# Patient Record
Sex: Female | Born: 1960 | Race: Black or African American | Hispanic: No | Marital: Single | Smoking: Never smoker
Health system: Southern US, Community
[De-identification: ages and names within clinical notes are randomized; demographics above are authoritative.]

## PROBLEM LIST (undated history)

## (undated) DIAGNOSIS — I10 Essential (primary) hypertension: Secondary | ICD-10-CM

## (undated) DIAGNOSIS — K219 Gastro-esophageal reflux disease without esophagitis: Secondary | ICD-10-CM

## (undated) DIAGNOSIS — Z87440 Personal history of urinary (tract) infections: Secondary | ICD-10-CM

## (undated) DIAGNOSIS — M1990 Unspecified osteoarthritis, unspecified site: Secondary | ICD-10-CM

## (undated) DIAGNOSIS — F32A Depression, unspecified: Secondary | ICD-10-CM

## (undated) DIAGNOSIS — F329 Major depressive disorder, single episode, unspecified: Secondary | ICD-10-CM

## (undated) DIAGNOSIS — J45909 Unspecified asthma, uncomplicated: Secondary | ICD-10-CM

## (undated) DIAGNOSIS — H409 Unspecified glaucoma: Secondary | ICD-10-CM

## (undated) HISTORY — DX: Major depressive disorder, single episode, unspecified: F32.9

## (undated) HISTORY — DX: Unspecified asthma, uncomplicated: J45.909

## (undated) HISTORY — DX: Personal history of urinary (tract) infections: Z87.440

## (undated) HISTORY — DX: Unspecified osteoarthritis, unspecified site: M19.90

## (undated) HISTORY — DX: Depression, unspecified: F32.A

## (undated) HISTORY — DX: Essential (primary) hypertension: I10

## (undated) HISTORY — DX: Gastro-esophageal reflux disease without esophagitis: K21.9

## (undated) HISTORY — DX: Unspecified glaucoma: H40.9

## (undated) HISTORY — PX: CARPAL TUNNEL RELEASE: SHX101

---

## 2001-08-07 HISTORY — PX: ABDOMINAL HYSTERECTOMY: SHX81

## 2013-08-06 DIAGNOSIS — G4733 Obstructive sleep apnea (adult) (pediatric): Secondary | ICD-10-CM | POA: Insufficient documentation

## 2014-11-18 DIAGNOSIS — Z90710 Acquired absence of both cervix and uterus: Secondary | ICD-10-CM | POA: Insufficient documentation

## 2017-11-30 DIAGNOSIS — Z0000 Encounter for general adult medical examination without abnormal findings: Secondary | ICD-10-CM | POA: Diagnosis not present

## 2017-11-30 DIAGNOSIS — I10 Essential (primary) hypertension: Secondary | ICD-10-CM | POA: Diagnosis not present

## 2017-11-30 DIAGNOSIS — N390 Urinary tract infection, site not specified: Secondary | ICD-10-CM | POA: Diagnosis not present

## 2017-12-14 DIAGNOSIS — I10 Essential (primary) hypertension: Secondary | ICD-10-CM | POA: Diagnosis not present

## 2017-12-14 DIAGNOSIS — J45909 Unspecified asthma, uncomplicated: Secondary | ICD-10-CM | POA: Diagnosis not present

## 2017-12-14 DIAGNOSIS — R002 Palpitations: Secondary | ICD-10-CM | POA: Diagnosis not present

## 2018-02-08 DIAGNOSIS — J018 Other acute sinusitis: Secondary | ICD-10-CM | POA: Diagnosis not present

## 2018-02-08 DIAGNOSIS — Z1231 Encounter for screening mammogram for malignant neoplasm of breast: Secondary | ICD-10-CM | POA: Diagnosis not present

## 2018-02-08 DIAGNOSIS — R05 Cough: Secondary | ICD-10-CM | POA: Diagnosis not present

## 2018-03-27 ENCOUNTER — Other Ambulatory Visit (INDEPENDENT_AMBULATORY_CARE_PROVIDER_SITE_OTHER): Payer: Federal, State, Local not specified - PPO

## 2018-03-27 ENCOUNTER — Ambulatory Visit: Payer: Federal, State, Local not specified - PPO | Admitting: Nurse Practitioner

## 2018-03-27 ENCOUNTER — Encounter: Payer: Self-pay | Admitting: Nurse Practitioner

## 2018-03-27 VITALS — BP 114/84 | HR 77 | Ht 59.0 in | Wt 189.0 lb

## 2018-03-27 DIAGNOSIS — I10 Essential (primary) hypertension: Secondary | ICD-10-CM | POA: Diagnosis not present

## 2018-03-27 DIAGNOSIS — J4520 Mild intermittent asthma, uncomplicated: Secondary | ICD-10-CM

## 2018-03-27 DIAGNOSIS — R42 Dizziness and giddiness: Secondary | ICD-10-CM

## 2018-03-27 DIAGNOSIS — R079 Chest pain, unspecified: Secondary | ICD-10-CM

## 2018-03-27 DIAGNOSIS — M545 Low back pain: Secondary | ICD-10-CM

## 2018-03-27 DIAGNOSIS — R410 Disorientation, unspecified: Secondary | ICD-10-CM | POA: Diagnosis not present

## 2018-03-27 DIAGNOSIS — F32A Depression, unspecified: Secondary | ICD-10-CM | POA: Insufficient documentation

## 2018-03-27 DIAGNOSIS — J45909 Unspecified asthma, uncomplicated: Secondary | ICD-10-CM | POA: Insufficient documentation

## 2018-03-27 DIAGNOSIS — G8929 Other chronic pain: Secondary | ICD-10-CM | POA: Insufficient documentation

## 2018-03-27 DIAGNOSIS — F329 Major depressive disorder, single episode, unspecified: Secondary | ICD-10-CM

## 2018-03-27 LAB — COMPREHENSIVE METABOLIC PANEL
ALK PHOS: 86 U/L (ref 39–117)
ALT: 27 U/L (ref 0–35)
AST: 16 U/L (ref 0–37)
Albumin: 4.1 g/dL (ref 3.5–5.2)
BILIRUBIN TOTAL: 0.9 mg/dL (ref 0.2–1.2)
BUN: 13 mg/dL (ref 6–23)
CO2: 28 meq/L (ref 19–32)
CREATININE: 0.76 mg/dL (ref 0.40–1.20)
Calcium: 10 mg/dL (ref 8.4–10.5)
Chloride: 103 mEq/L (ref 96–112)
GFR: 100.85 mL/min (ref >60.00)
GLUCOSE: 97 mg/dL (ref 70–99)
Potassium: 3.7 mEq/L (ref 3.5–5.1)
Sodium: 140 mEq/L (ref 135–145)
TOTAL PROTEIN: 8 g/dL (ref 6.0–8.3)

## 2018-03-27 LAB — CBC
HCT: 40.5 % (ref 36.0–46.0)
HEMOGLOBIN: 14 g/dL (ref 12.0–15.0)
MCHC: 34.7 g/dL (ref 30.0–36.0)
MCV: 82.1 fl (ref 78.0–100.0)
Platelets: 296 10*3/uL (ref 150.0–400.0)
RBC: 4.94 Mil/uL (ref 3.87–5.11)
RDW: 14.3 % (ref 11.5–15.5)
WBC: 9.2 10*3/uL (ref 4.0–10.5)

## 2018-03-27 LAB — TSH: TSH: 0.99 u[IU]/mL (ref 0.35–4.50)

## 2018-03-27 LAB — HEMOGLOBIN A1C: HEMOGLOBIN A1C: 5.4 % (ref 4.6–6.5)

## 2018-03-27 LAB — MAGNESIUM: Magnesium: 2.2 mg/dL (ref 1.5–2.5)

## 2018-03-27 LAB — VITAMIN B12: Vitamin B-12: 585 pg/mL (ref 211–911)

## 2018-03-27 MED ORDER — ALBUTEROL SULFATE HFA 108 (90 BASE) MCG/ACT IN AERS: 2.0000 | INHALATION_SPRAY | Freq: Four times a day (QID) | RESPIRATORY_TRACT | 0 refills | Status: DC | PRN

## 2018-03-27 MED ORDER — LOSARTAN POTASSIUM 50 MG PO TABS: 50.0000 mg | ORAL_TABLET | Freq: Every day | ORAL | 1 refills | Status: DC

## 2018-03-27 NOTE — Progress Notes (Signed)
Name: Gabriella Porter   MRN: [id number]    DOB: Jun 30, 1961   Date:03/27/2018       Progress Note  Subjective  Chief Complaint  Chief Complaint  Patient presents with  . Establish Care    HPI Gabriella Porter is here today to establish care as a new patient to our practice, just moved to [place] from [place] and has not established with a new provider in [place]. She is maintained on a daily 325 asa, OTC MVI and vitamin D supplement. She is also maintained on lisinopril-HCTZ 20-12.5 daily for hypertension, takes medication daily as directed, has noticed some "high and low blood pressure readings" when she checks her blood pressure occasionally at home. She is requesting an albuterol refill today, which she uses occasionally for seasonal asthma, related to allergies, uses inhaler no more frequently than 1-2 times per month. She would like to discuss complaints of depression and confusion today. She tells me that she moved to [place] due to financial hardships she's been experiencing over the past 3 years, currently in court battle for monetary dispute with a Surveyor, mineralscontractor. She is also taking care of chronically ill mother here in [place]. She tells me that she frequently cries, feels she can't get her words out, and knows she is depressed. She was on celexa in the past for depression, did seem to help her some but never really felt fully better on the medication and has been off for some time. She denies SI, HI. She also tells me that she has been experienced several episodes of  memory loss and slurred speech over the past several months. She drives 1.5 hours to work daily and one day she even thinks she "blacked out' while driving her car, unsure of duration of episode, woke still driving her car. She says she occasionally feels dizzy and experiences intermittent chest pain and shortness of breath. She also experiences numbness and tingling to bilateral hands which she attributes to her chronic back pain. She denies fevers, weight loss,  loss of appetite, abdominal pain, vomiting, dysuria, hematuria, urinary frequency, constipation, diarrhea, edema.  BP Readings from Last 3 Encounters:  03/27/18 114/84     Past Medical History:  Diagnosis Date  . Arthritis   . Asthma   . Depression   . GERD (gastroesophageal reflux disease)   . History of recurrent UTI (urinary tract infection)   . Hypertension     Past Surgical History:  Procedure Laterality Date  . ABDOMINAL HYSTERECTOMY  2003    Family History  Problem Relation Age of Onset  . Diabetes Mother   . Heart disease Mother   . Hyperlipidemia Mother   . Hypertension Mother   . Stroke Mother   . Diabetes Father   . Arthritis Father   . Heart disease Father   . Kidney disease Father   . Cancer Sister   . Diabetes Maternal Grandmother   . Heart disease Maternal Grandmother   . Hypertension Maternal Grandmother   . Diabetes Maternal Grandfather   . Heart disease Maternal Grandfather   . Hypertension Maternal Grandfather   . Diabetes Paternal Grandmother   . Heart disease Paternal Grandmother   . Hypertension Paternal Grandmother   . Diabetes Paternal Grandfather   . Heart disease Paternal Grandfather   . Hypertension Paternal Grandfather   . Hyperlipidemia Sister   . Bipolar disorder Sister     Social History   Socioeconomic History  . Marital status: Single    Spouse name: Not on  file  . Number of children: Not on file  . Years of education: Not on file  . Highest education level: Not on file  Occupational History  . Not on file  Social Needs  . Financial resource strain: Not on file  . Food insecurity:    Worry: Not on file    Inability: Not on file  . Transportation needs:    Medical: Not on file    Non-medical: Not on file  Tobacco Use  . Smoking status: Never Smoker  . Smokeless tobacco: Never Used  Substance and Sexual Activity  . Alcohol use: Yes  . Drug use: Never  . Sexual activity: Not on file  Lifestyle  . Physical  activity:    Days per week: Not on file    Minutes per session: Not on file  . Stress: Not on file  Relationships  . Social connections:    Talks on phone: Not on file    Gets together: Not on file    Attends religious service: Not on file    Active member of club or organization: Not on file    Attends meetings of clubs or organizations: Not on file    Relationship status: Not on file  . Intimate partner violence:    Fear of current or ex partner: Not on file    Emotionally abused: Not on file    Physically abused: Not on file    Forced sexual activity: Not on file  Other Topics Concern  . Not on file  Social History Narrative   Works as Company secretaryinvestigative analyst     Current Outpatient Medications:  .  aspirin 325 MG tablet, Take 325 mg by mouth daily., Disp: , Rfl:  .  Multiple Vitamins-Minerals (ONE DAILY CALCIUM/IRON PO), Take 1 each by mouth daily., Disp: , Rfl:  .  Vitamin D3 (VITAMIN D) 25 MCG tablet, Take 1,000 Units by mouth daily., Disp: , Rfl:  .  albuterol (PROVENTIL HFA;VENTOLIN HFA) 108 (90 Base) MCG/ACT inhaler, Inhale 2 puffs into the lungs every 6 (six) hours as needed for wheezing or shortness of breath., Disp: 1 Inhaler, Rfl: 0 .  losartan (COZAAR) 50 MG tablet, Take 1 tablet (50 mg total) by mouth daily., Disp: 30 tablet, Rfl: 1  Allergies  Allergen Reactions  . Sulfa Antibiotics      ROS See HPI  Objective  Vitals:   03/27/18 0819  BP: 114/84  Pulse: 77  SpO2: 97%  Weight: 189 lb (85.7 kg)  Height: 4\' 11"  (1.499 m)    Body mass index is 38.17 kg/m.  Physical Exam Vital signs reviewed. Constitutional: Patient appears well-developed and well-nourished. No distress.  HENT: Head: Normocephalic and atraumatic.  Nose: Nose normal. Mouth/Throat: Oropharynx is clear and moist. No oropharyngeal exudate.  Eyes: Conjunctivae and EOM are normal. Pupils are equal, round, and reactive to light. No scleral icterus.  Neck: Normal range of motion. Neck  supple. No thyromegaly present.   Cardiovascular: Normal rate, regular rhythm and normal heart sounds.  No murmur heard. No BLE edema. Distal pulses intact. Pulmonary/Chest: Effort normal and breath sounds normal. No respiratory distress. Abdominal: Soft. Bowel sounds are normal, no distension.   Musculoskeletal: Normal range of motion,  No gross deformities Neurological: She is alert and oriented to person, place, and time. No cranial nerve deficit. Coordination, balance, strength, speech and gait are normal.  Skin: Skin is warm and dry. No rash noted. No erythema.  Psychiatric: Patient has a normal mood and  affect. behavior is normal. Judgment and thought content normal.   Assessment & Plan RTC in 2 weeks for F/U: HTN- medication adjustment, Depression- consider starting medication therapy  Chest pain, unspecified type Update labs Red flags and return precautions including when to seek emergency care discussed with patient during OV F/U with further recommendations pending lab results - CBC; Future - Comprehensive metabolic panel; Future - Hemoglobin A1c; Future - TSH; Future - Vitamin B12; Future - Magnesium; Future - Ambulatory referral to Cardiology - EKG 12-Lead-I have personally reviewed the EKG tracing and agree with computerized printout as noted: sinus rhythm, no acute abnormality, no old EKG available for comparison  Lightheadedness Due to episodes of confusion and possible syncope I have instructed her not to drive her car until evaluated by speciality Urgent Referral to neurology placed, cardiology referral placed Red flags and return precautions including when to seek emergency care discussed with patient during OV Update labs today F/U with further recommendations pending lab results - CBC; Future - Comprehensive metabolic panel; Future - Hemoglobin A1c; Future - TSH; Future - Vitamin B12; Future - Magnesium; Future - Ambulatory referral to Neurology - Ambulatory  referral to Cardiology  Confusion Due to episodes of confusion and possible syncope I have instructed her not to drive her car until evaluation by specialty Urgent Referral to neurology placed Red flags and return precautions including when to seek emergency care discussed with patient during OV - Ambulatory referral to Neurology - Hemoglobin A1c; Future

## 2018-03-27 NOTE — Assessment & Plan Note (Signed)
She is interested in medication therapy but we are making adjustments to her blood pressure medications today so will hold on adding another medication for now Referral to psychology for counseling placed, additional education and information provided on AVS including return precautions and when to seek emergency care RTC in 2 weeks, can consider adding medication if BP stable - Ambulatory referral to Psychology

## 2018-03-27 NOTE — Assessment & Plan Note (Signed)
BP reading slightly low today with reports of dizziness, lightheadedness Will stop lisinopril-HCTZ and start losartan-dosing and side effects discussed RTC in 2 weeks for F/U: recheck BP - CBC; Future - Comprehensive metabolic panel; Future - TSH; Future - losartan (COZAAR) 50 MG tablet; Take 1 tablet (50 mg total) by mouth daily.  Dispense: 30 tablet; Refill: 1

## 2018-03-27 NOTE — Assessment & Plan Note (Signed)
Stable Continue albuterol Prn F/U for new, worsening symptoms - albuterol (PROVENTIL HFA;VENTOLIN HFA) 108 (90 Base) MCG/ACT inhaler; Inhale 2 puffs into the lungs every 6 (six) hours as needed for wheezing or shortness of breath.  Dispense: 1 Inhaler; Refill: 0

## 2018-03-27 NOTE — Patient Instructions (Addendum)
Please head downstairs for lab work/x-rays.Marland Kitchen.abnormal, you will be contacted right away. Otherwise, I will contact you within a week about your test results and follow up recommendations  Referrals ordered: cardiology, neurology, psychology  Please stop your lisinopril-HCTZ. Please start losartan 50 once daily. Please return in about 2 weeks so I can recheck your blood pressure and we can figure out what to do next.  Please do not drive your car at this time, you should not drive until seen by neurology.  Living With Depression Everyone experiences occasional disappointment, sadness, and loss in their lives. When you are feeling down, blue, or sad for at least 2 weeks in a row, it may mean that you have depression. Depression can affect your thoughts and feelings, relationships, daily activities, and physical health. It is caused by changes in the way your brain functions. If you receive a diagnosis of depression, your health care provider will tell you which type of depression you have and what treatment options are available to you. If you are living with depression, there are ways to help you recover from it and also ways to prevent it from coming back. How to cope with lifestyle changes Coping with stress Stress is your body's reaction to life changes and events, both good and bad. Stressful situations may include:  Getting married.  The death of a spouse.  Losing a job.  Retiring.  Having a baby.  Stress can last just a few hours or it can be ongoing. Stress can play a major role in depression, so it is important to learn both how to cope with stress and how to think about it differently. Talk with your health care provider or a counselor if you would like to learn more about stress reduction. He or she may suggest some stress reduction techniques, such as:  Music therapy. This can include creating music or listening to music. Choose music that you enjoy and that inspires  you.  Mindfulness-based meditation. This kind of meditation can be done while sitting or walking. It involves being aware of your normal breaths, rather than trying to control your breathing.  Centering prayer. This is a kind of meditation that involves focusing on a spiritual word or phrase. Choose a word, phrase, or sacred image that is meaningful to you and that brings you peace.  Deep breathing. To do this, expand your stomach and inhale slowly through your nose. Hold your breath for 3-5 seconds, then exhale slowly, allowing your stomach muscles to relax.  Muscle relaxation. This involves intentionally tensing muscles then relaxing them.  Choose a stress reduction technique that fits your lifestyle and personality. Stress reduction techniques take time and practice to develop. Set aside 5-15 minutes a day to do them. Therapists can offer training in these techniques. The training may be covered by some insurance plans. Other things you can do to manage stress include:  Keeping a stress diary. This can help you learn what triggers your stress and ways to control your response.  Understanding what your limits are and saying no to requests or events that lead to a schedule that is too full.  Thinking about how you respond to certain situations. You may not be able to control everything, but you can control how you react.  Adding humor to your life by watching funny films or TV shows.  Making time for activities that help you relax and not feeling guilty about spending your time this way.  Medicines Your health care provider  may suggest certain medicines if he or she feels that they will help improve your condition. Avoid using alcohol and other substances that may prevent your medicines from working properly (may interact). It is also important to:  Talk with your pharmacist or health care provider about all the medicines that you take, their possible side effects, and what medicines are  safe to take together.  Make it your goal to take part in all treatment decisions (shared decision-making). This includes giving input on the side effects of medicines. It is best if shared decision-making with your health care provider is part of your total treatment plan.  If your health care provider prescribes a medicine, you may not notice the full benefits of it for 4-8 weeks. Most people who are treated for depression need to be on medicine for at least 6-12 months after they feel better. If you are taking medicines as part of your treatment, do not stop taking medicines without first talking to your health care provider. You may need to have the medicine slowly decreased (tapered) over time to decrease the risk of harmful side effects. Relationships Your health care provider may suggest family therapy along with individual therapy and drug therapy. While there may not be family problems that are causing you to feel depressed, it is still important to make sure your family learns as much as they can about your mental health. Having your family's support can help make your treatment successful. How to recognize changes in your condition Everyone has a different response to treatment for depression. Recovery from major depression happens when you have not had signs of major depression for two months. This may mean that you will start to:  Have more interest in doing activities.  Feel less hopeless than you did 2 months ago.  Have more energy.  Overeat less often, or have better or improving appetite.  Have better concentration.  Your health care provider will work with you to decide the next steps in your recovery. It is also important to recognize when your condition is getting worse. Watch for these signs:  Having fatigue or low energy.  Eating too much or too little.  Sleeping too much or too little.  Feeling restless, agitated, or hopeless.  Having trouble concentrating or  making decisions.  Having unexplained physical complaints.  Feeling irritable, angry, or aggressive.  Get help as soon as you or your family members notice these symptoms coming back. How to get support and help from others How to talk with friends and family members about your condition Talking to friends and family members about your condition can provide you with one way to get support and guidance. Reach out to trusted friends or family members, explain your symptoms to them, and let them know that you are working with a health care provider to treat your depression. Financial resources Not all insurance plans cover mental health care, so it is important to check with your insurance carrier. If paying for co-pays or counseling services is a problem, search for a local or county mental health care center. They may be able to offer public mental health care services at low or no cost when you are not able to see a private health care provider. If you are taking medicine for depression, you may be able to get the generic form, which may be less expensive. Some makers of prescription medicines also offer help to patients who cannot afford the medicines they need. Follow these instructions  at home:  Get the right amount and quality of sleep.  Cut down on using caffeine, tobacco, alcohol, and other potentially harmful substances.  Try to exercise, such as walking or lifting small weights.  Take over-the-counter and prescription medicines only as told by your health care provider.  Eat a healthy diet that includes plenty of vegetables, fruits, whole grains, low-fat dairy products, and lean protein. Do not eat a lot of foods that are high in solid fats, added sugars, or salt.  Keep all follow-up visits as told by your health care provider. This is important. Contact a health care provider if:  You stop taking your antidepressant medicines, and you have any of these  symptoms: ? Nausea. ? Headache. ? Feeling lightheaded. ? Chills and body aches. ? Not being able to sleep (insomnia).  You or your friends and family think your depression is getting worse. Get help right away if:  You have thoughts of hurting yourself or others. If you ever feel like you may hurt yourself or others, or have thoughts about taking your own life, get help right away. You can go to your nearest emergency department or call:  Your local emergency services (911 in the [place]  A suicide crisis helpline, such as the National Suicide Prevention Lifeline at 352-042-36901-(914)715-4292. This is open 24-hours a day.  Summary  If you are living with depression, there are ways to help you recover from it and also ways to prevent it from coming back.  Work with your health care team to create a management plan that includes counseling, stress management techniques, and healthy lifestyle habits. This information is not intended to replace advice given to you by your health care provider. Make sure you discuss any questions you have with your health care provider. Document Released: 06/26/2016 Document Revised: 06/26/2016 Document Reviewed: 06/26/2016 Elsevier Interactive Patient Education  [employer]2018 Elsevier Inc.

## 2018-03-28 ENCOUNTER — Telehealth: Payer: Self-pay | Admitting: Nurse Practitioner

## 2018-03-28 NOTE — Telephone Encounter (Signed)
-----   Message from Ottis Stainerri L Slaugenhaupt, CCC-SLP sent at 03/28/2018 11:30 AM EDT ----- FYI:  Your patient is scheduled to see Salomon Fickerri Bauert on 05/03/18. Pt is aware.  Thank you for the referral.

## 2018-03-29 ENCOUNTER — Encounter: Payer: Self-pay | Admitting: Neurology

## 2018-03-29 ENCOUNTER — Ambulatory Visit: Payer: Federal, State, Local not specified - PPO | Admitting: Neurology

## 2018-03-29 ENCOUNTER — Encounter (INDEPENDENT_AMBULATORY_CARE_PROVIDER_SITE_OTHER): Payer: Self-pay

## 2018-03-29 ENCOUNTER — Ambulatory Visit: Payer: Federal, State, Local not specified - PPO | Admitting: Internal Medicine

## 2018-03-29 ENCOUNTER — Encounter: Payer: Self-pay | Admitting: Internal Medicine

## 2018-03-29 VITALS — BP 132/93 | HR 87 | Ht 59.0 in | Wt 190.0 lb

## 2018-03-29 VITALS — BP 126/64 | HR 90 | Ht 59.0 in | Wt 194.0 lb

## 2018-03-29 DIAGNOSIS — Z13220 Encounter for screening for lipoid disorders: Secondary | ICD-10-CM

## 2018-03-29 DIAGNOSIS — G4733 Obstructive sleep apnea (adult) (pediatric): Secondary | ICD-10-CM

## 2018-03-29 DIAGNOSIS — F32A Depression, unspecified: Secondary | ICD-10-CM

## 2018-03-29 DIAGNOSIS — R413 Other amnesia: Secondary | ICD-10-CM | POA: Diagnosis not present

## 2018-03-29 DIAGNOSIS — R0789 Other chest pain: Secondary | ICD-10-CM

## 2018-03-29 DIAGNOSIS — I10 Essential (primary) hypertension: Secondary | ICD-10-CM | POA: Diagnosis not present

## 2018-03-29 DIAGNOSIS — F329 Major depressive disorder, single episode, unspecified: Secondary | ICD-10-CM | POA: Diagnosis not present

## 2018-03-29 DIAGNOSIS — R0602 Shortness of breath: Secondary | ICD-10-CM

## 2018-03-29 MED ORDER — ASPIRIN EC 81 MG PO TBEC: 81.0000 mg | DELAYED_RELEASE_TABLET | Freq: Every day | ORAL | Status: AC

## 2018-03-29 MED ORDER — BUPROPION HCL ER (SR) 100 MG PO TB12: 100.0000 mg | ORAL_TABLET | Freq: Every day | ORAL | 3 refills | Status: DC

## 2018-03-29 NOTE — Patient Instructions (Signed)
We will start Wellbutrin for stress.

## 2018-03-29 NOTE — Patient Instructions (Addendum)
Medication Instructions:  Your physician has recommended you make the following change in your medication:   DECREASE Aspirin to 81 mg once daily   Labwork: TODAY - cholesterol   Testing/Procedures: Your physician has requested that you have an echocardiogram. Echocardiography is a painless test that uses sound waves to create images of your heart. It provides your doctor with information about the size and shape of your heart and how well your heart's chambers and valves are working. This procedure takes approximately one hour. There are no restrictions for this procedure.   Follow-Up: Your physician recommends that you schedule a follow-up appointment in: as needed based on test results   If you need a refill on your cardiac medications before your next appointment, please call your pharmacy.   Thank you for choosing [facility]! Gabriella BridegroomMichelle Sheba Whaling, RN [phone]

## 2018-03-29 NOTE — Progress Notes (Signed)
Cardiology Office Note   Date:  03/29/2018   ID:  Gabriella Porter Harkey, DOB 01-02-1961, MRN [id number]  PCP:  Evaristo BuryShambley, Ashleigh N, NP  Cardiologist:   Dietrich PatesPaula Guillaume Weninger, MD    Pt referred by Donnalee CurryA Shambley for   History of Present Illness: Gabriella Porter Dimascio is a 57 y.o. female with a history of chest tightness , lightheadeness and swelling   Pt says sometimes she feels  Tightness   Sometimes in bed can feel pains   Sharp   Bought gas pills   May have helped a little but not much  Fades Episodes do not occur every day   Began about 1 year ago   Getting more noticeable     When not having spells she says she feels fatigued   Day starts early though  3 AM   Out by 4 am  Works as Psychologist, occupationaladministrator   Drives    Pt has been on asiprin 325  Years ago had stress test   In [place]   Reported OK   Current Meds  Medication Sig  . albuterol (PROVENTIL HFA;VENTOLIN HFA) 108 (90 Base) MCG/ACT inhaler Inhale 2 puffs into the lungs every 6 (six) hours as needed for wheezing or shortness of breath.  Marland Kitchen. aspirin 325 MG tablet Take 325 mg by mouth daily.  Marland Kitchen. buPROPion (WELLBUTRIN SR) 100 MG 12 hr tablet Take 1 tablet (100 mg total) by mouth daily.  Marland Kitchen. losartan (COZAAR) 50 MG tablet Take 1 tablet (50 mg total) by mouth daily.  . Multiple Vitamins-Minerals (ONE DAILY CALCIUM/IRON PO) Take 1 each by mouth daily.  . Vitamin D3 (VITAMIN D) 25 MCG tablet Take 1,000 Units by mouth daily.     Allergies:   Sulfa antibiotics   Past Medical History:  Diagnosis Date  . Arthritis   . Asthma   . Depression   . GERD (gastroesophageal reflux disease)   . History of recurrent UTI (urinary tract infection)   . Hypertension     Past Surgical History:  Procedure Laterality Date  . ABDOMINAL HYSTERECTOMY  2003     Social History:  The patient  reports that she has never smoked. She has never used smokeless tobacco. She reports that she drinks alcohol. She reports that she does not use drugs.   Family History:  The patient's  family history includes Arthritis in her father; Bipolar disorder in her sister; Cancer in her sister; Diabetes in her father, maternal grandfather, maternal grandmother, mother, paternal grandfather, and paternal grandmother; Heart disease in her father, maternal grandfather, maternal grandmother, mother, paternal grandfather, and paternal grandmother; Hyperlipidemia in her mother and sister; Hypertension in her maternal grandfather, maternal grandmother, mother, paternal grandfather, and paternal grandmother; Kidney disease in her father; Stroke in her mother.    ROS:  Please see the history of present illness. All other systems are reviewed and  Negative to the above problem except as noted.    PHYSICAL EXAM: VS:  BP 126/64   Pulse 90   Ht 4\' 11"  (1.499 m)   Wt 194 lb (88 kg)   LMP 08/07/2001   SpO2 99%   BMI 39.18 kg/m   GEN: Well nourished, well developed, in no acute distress  HEENT: normal  Neck: no JVD, carotid bruits, or masses Cardiac: RRR; no murmurs, rubs, or gallops,no edema  Respiratory:  clear to auscultation bilaterally, normal work of breathing GI: soft, nontender, nondistended, + BS  No hepatomegaly  MS: no deformity Moving all extremities   Skin:  warm and dry, no rash Neuro:  Strength and sensation are intact Psych: euthymic mood, full affect   EKG:  EKG is not ordered today.  On 8/21  SR 73 bpm   Nonspecific ST changes    Lipid Panel No results found for: CHOL, TRIG, HDL, CHOLHDL, VLDL, LDLCALC, LDLDIRECT    Wt Readings from Last 3 Encounters:  03/29/18 194 lb (88 kg)  03/29/18 190 lb (86.2 kg)  03/27/18 189 lb (85.7 kg)      ASSESSMENT AND PLAN:  1  Chest tightness   Atypical for coronary ischemia   WIll set up for echo to evaluate systolic / diastolic properties of heart   Consider stress test     2   Lpids   Check on lipids today      3  HTN   Adequate control  4   Edema   No significant edema  Current medicines are reviewed at length with the  patient today.  The patient does not have concerns regarding medicines.  Signed, Dietrich Pates, MD  03/29/2018 3:29 PM    [facility] [address] [place], [place], [place]  [address] Phone: [phone]; Fax: [phone]

## 2018-03-29 NOTE — Progress Notes (Signed)
Reason for visit: Memory disturbance  Referring physician: Dr. Lenice Llamas is a 57 y.o. female  History of present illness:  Ms. Michaelson is a 57 year old right-handed black female with a history of problems with memory that she claims has been present off and on over the last 7 years or more.  The patient indicates that she has had episodes of increased stress off and on her life that have paralleled problems with memory and concentration.  She also has had an evaluation for sleep apnea in the past, the diagnosis was apparently made only from a home study, the patient was placed on CPAP from that point, but she could not tolerate the CPAP.  The patient has not been actively treated for sleep apnea.  The patient has had another episode of increased stress recently, she has moved from [place] to [place] in March 2019, she has had issues with a Marketing executive who she has had to take to court.  The increased stress has resulted in cognitive decompensation once again.  The patient feels that she has difficulty with remembering names for people and short-term memory problems.  The patient is able to operate a motor vehicle safely without difficulty, but she relies on GPS to get around.  She is able to manage her own medications and appointments, she manages her own finances.  She continues to work and seems to function fairly well at work.  She does report some underlying fatigue, but she is able to manage a 1.5 Hour drive to and from work without getting sleepy or drowsy.  The patient does snore at night.  The patient reports some low back pain and some right leg numbness at times, she denies any weakness in the legs, she denies any significant balance problems but she does feel slightly imbalanced on occasion.  She reports occasional mild dizziness, no headaches.  She reports that her elderly mother at age 39 does have some memory problems.  The patient has been treated with  Celexa in the past for depression.  The patient has episodes of crying spells associated with her stress.  She is sent to this office for an evaluation.  Past Medical History:  Diagnosis Date  . Arthritis   . Asthma   . Depression   . GERD (gastroesophageal reflux disease)   . History of recurrent UTI (urinary tract infection)   . Hypertension     Past Surgical History:  Procedure Laterality Date  . ABDOMINAL HYSTERECTOMY  2003    Family History  Problem Relation Age of Onset  . Diabetes Mother   . Heart disease Mother   . Hyperlipidemia Mother   . Hypertension Mother   . Stroke Mother   . Diabetes Father   . Arthritis Father   . Heart disease Father   . Kidney disease Father   . Cancer Sister   . Diabetes Maternal Grandmother   . Heart disease Maternal Grandmother   . Hypertension Maternal Grandmother   . Diabetes Maternal Grandfather   . Heart disease Maternal Grandfather   . Hypertension Maternal Grandfather   . Diabetes Paternal Grandmother   . Heart disease Paternal Grandmother   . Hypertension Paternal Grandmother   . Diabetes Paternal Grandfather   . Heart disease Paternal Grandfather   . Hypertension Paternal Grandfather   . Hyperlipidemia Sister   . Bipolar disorder Sister     Social history:  reports that she has never smoked. She has never used  smokeless tobacco. She reports that she drinks alcohol. She reports that she does not use drugs.  Medications:  Prior to Admission medications   Medication Sig Start Date End Date Taking? Authorizing Provider  albuterol (PROVENTIL HFA;VENTOLIN HFA) 108 (90 Base) MCG/ACT inhaler Inhale 2 puffs into the lungs every 6 (six) hours as needed for wheezing or shortness of breath. 03/27/18  Yes Evaristo BuryShambley, Ashleigh N, NP  aspirin 325 MG tablet Take 325 mg by mouth daily.   Yes [provider]  losartan (COZAAR) 50 MG tablet Take 1 tablet (50 mg total) by mouth daily. 03/27/18  Yes Evaristo BuryShambley, Ashleigh N, NP  Multiple  Vitamins-Minerals (ONE DAILY CALCIUM/IRON PO) Take 1 each by mouth daily.   Yes [provider]  Vitamin D3 (VITAMIN D) 25 MCG tablet Take 1,000 Units by mouth daily.   Yes [provider]  buPROPion (WELLBUTRIN SR) 100 MG 12 hr tablet Take 1 tablet (100 mg total) by mouth daily. 03/29/18   York SpanielWillis, Charles K, MD      Allergies  Allergen Reactions  . Sulfa Antibiotics     ROS:  Out of a complete 14 system review of symptoms, the patient complains only of the following symptoms, and all other reviewed systems are negative.  Memory problems Depression, anxiety  Blood pressure (!) 132/93, pulse 87, height 4\' 11"  (1.499 m), weight 190 lb (86.2 kg), last menstrual period 08/07/2001.  Physical Exam  General: The patient is alert and cooperative at the time of the examination.  The patient is moderately to markedly obese.  Eyes: Pupils are equal, round, and reactive to light. Discs are flat bilaterally.  Neck: The neck is supple, no carotid bruits are noted.  Respiratory: The respiratory examination is clear.  Cardiovascular: The cardiovascular examination reveals a regular rate and rhythm, no obvious murmurs or rubs are noted.  Skin: Extremities are without significant edema.  Neurologic Exam  Mental status: The patient is alert and oriented x 3 at the time of the examination. The Mini-Mental status examination done today shows a total score 25/30.  Cranial nerves: Facial symmetry is present. There is good sensation of the face to pinprick and soft touch bilaterally. The strength of the facial muscles and the muscles to head turning and shoulder shrug are normal bilaterally. Speech is well enunciated, no aphasia or dysarthria is noted. Extraocular movements are full. Visual fields are full. The tongue is midline, and the patient has symmetric elevation of the soft palate. No obvious hearing deficits are noted.  Motor: The motor testing reveals 5 over 5 strength of  all 4 extremities. Good symmetric motor tone is noted throughout.  Sensory: Sensory testing is intact to pinprick, soft touch, vibration sensation, and position sense on all 4 extremities. No evidence of extinction is noted.  Coordination: Cerebellar testing reveals good finger-nose-finger and heel-to-shin bilaterally.  Gait and station: Gait is normal. Tandem gait is normal. Romberg is negative. No drift is seen.  Reflexes: Deep tendon reflexes are symmetric and normal bilaterally. Toes are downgoing bilaterally.   Assessment/Plan:  1.  Reported memory disturbance  2.  History of sleep apnea, untreated  3.  Depression and anxiety  By the patient's own history, she has had a 7-year history of intermittent episodes of problems with cognitive decompensation associated with periods of stress.  The patient is under a lot of stress recently, and once again she has decompensated.  The patient will be sent for MRI of the brain, she will have blood work  done today.  A recent vitamin B12 level was unremarkable.  The patient will be sent for sleep evaluation to reevaluate her sleep apnea.  I have recommended that she see a psychologist or counselor in the future to help her cope with stress in a more effective way to prevent cognitive decompensation.  The patient will follow-up in 6 months, will follow the memory issues over time.  The patient will be placed on Wellbutrin at 100 mg daily, she will call for any dose adjustments.  Marlan Palau MD 03/29/2018 9:39 AM  [facility] [address] [place], [place] [address]  Phone [phone] Fax [phone]

## 2018-03-30 LAB — LIPID PANEL
CHOLESTEROL TOTAL: 155 mg/dL (ref 100–199)
Chol/HDL Ratio: 3.4 ratio (ref 0.0–4.4)
HDL: 45 mg/dL (ref >39)
LDL CALC: 81 mg/dL (ref 0–99)
TRIGLYCERIDES: 144 mg/dL (ref 0–149)
VLDL CHOLESTEROL CAL: 29 mg/dL (ref 5–40)

## 2018-03-30 LAB — SEDIMENTATION RATE: Sed Rate: 44 mm/hr — ABNORMAL HIGH (ref 0–40)

## 2018-03-30 LAB — RPR: RPR Ser Ql: NONREACTIVE

## 2018-03-30 LAB — HIV ANTIBODY (ROUTINE TESTING W REFLEX): HIV Screen 4th Generation wRfx: NONREACTIVE

## 2018-04-01 ENCOUNTER — Ambulatory Visit: Payer: Federal, State, Local not specified - PPO | Admitting: Neurology

## 2018-04-01 ENCOUNTER — Encounter

## 2018-04-01 ENCOUNTER — Telehealth: Payer: Self-pay | Admitting: Neurology

## 2018-04-01 NOTE — Telephone Encounter (Signed)
Spoke to patient she is scheduled for 04/02/18 for [facility].

## 2018-04-01 NOTE — Telephone Encounter (Signed)
spoke to pt she will get back to me  [employer] auth: NPR Ref # [id number]

## 2018-04-01 NOTE — Telephone Encounter (Signed)
[facility] to patient [facility]

## 2018-04-01 NOTE — Telephone Encounter (Signed)
Pt is asking to be called to schedule her MRI 

## 2018-04-02 ENCOUNTER — Other Ambulatory Visit (HOSPITAL_COMMUNITY): Payer: Self-pay

## 2018-04-02 ENCOUNTER — Ambulatory Visit: Payer: Federal, State, Local not specified - PPO

## 2018-04-02 DIAGNOSIS — R413 Other amnesia: Secondary | ICD-10-CM | POA: Diagnosis not present

## 2018-04-04 ENCOUNTER — Telehealth: Payer: Self-pay | Admitting: Neurology

## 2018-04-04 NOTE — Telephone Encounter (Signed)
I called the patient.  MRI of the brain was normal, the patient will be seeking a counselor to help her with her stress level in the near future.   MRI brain 04/04/17:  IMPRESSION:   MRI brain (without) demonstrating: - Non-specific fluid / inflammation in the left mastoid air cells noted.  - Brain parenchyma normal. No acute findings.

## 2018-04-05 ENCOUNTER — Other Ambulatory Visit: Payer: Self-pay

## 2018-04-05 ENCOUNTER — Ambulatory Visit (HOSPITAL_COMMUNITY): Payer: Federal, State, Local not specified - PPO | Attending: Internal Medicine

## 2018-04-05 ENCOUNTER — Telehealth: Payer: Self-pay

## 2018-04-05 DIAGNOSIS — R0789 Other chest pain: Secondary | ICD-10-CM | POA: Insufficient documentation

## 2018-04-05 DIAGNOSIS — Z13220 Encounter for screening for lipoid disorders: Secondary | ICD-10-CM | POA: Diagnosis not present

## 2018-04-05 DIAGNOSIS — R0602 Shortness of breath: Secondary | ICD-10-CM | POA: Diagnosis not present

## 2018-04-05 DIAGNOSIS — I119 Hypertensive heart disease without heart failure: Secondary | ICD-10-CM | POA: Insufficient documentation

## 2018-04-05 NOTE — Telephone Encounter (Signed)
Spoke with patient she expressed understanding and agreed to the exercise myoview. Made a letter and gave her the instructions over the phone. [facility]

## 2018-04-05 NOTE — Telephone Encounter (Signed)
-----   Message from Dietrich PatesPaula Ross V, MD sent at 04/05/2018  3:34 PM EDT ----- Pumping function of the heart is normal    Aortic valve is mildly thickened   Moves OK  Would set up for exercise myovue to evaluate for ischemia

## 2018-04-09 ENCOUNTER — Telehealth (HOSPITAL_COMMUNITY): Payer: Self-pay | Admitting: *Deleted

## 2018-04-09 NOTE — Telephone Encounter (Signed)
Patient given detailed instructions per Myocardial Perfusion Study Information Sheet for the test on 04/12/18 at 1015. Patient notified to arrive 15 minutes early and that it is imperative to arrive on time for appointment to keep from having the test rescheduled.  If you need to cancel or reschedule your appointment, please call the office within 24 hours of your appointment. . Patient verbalized understanding.Gabriella [place], [place]ella [place]

## 2018-04-12 ENCOUNTER — Ambulatory Visit (HOSPITAL_COMMUNITY): Payer: Federal, State, Local not specified - PPO | Attending: Cardiovascular Disease

## 2018-04-12 ENCOUNTER — Ambulatory Visit: Payer: Federal, State, Local not specified - PPO | Admitting: Nurse Practitioner

## 2018-04-12 ENCOUNTER — Encounter: Payer: Self-pay | Admitting: Nurse Practitioner

## 2018-04-12 VITALS — BP 110/78 | HR 81 | Ht 59.0 in | Wt 191.0 lb

## 2018-04-12 DIAGNOSIS — I10 Essential (primary) hypertension: Secondary | ICD-10-CM | POA: Diagnosis not present

## 2018-04-12 DIAGNOSIS — R0602 Shortness of breath: Secondary | ICD-10-CM | POA: Insufficient documentation

## 2018-04-12 DIAGNOSIS — R0789 Other chest pain: Secondary | ICD-10-CM | POA: Insufficient documentation

## 2018-04-12 DIAGNOSIS — E785 Hyperlipidemia, unspecified: Secondary | ICD-10-CM | POA: Insufficient documentation

## 2018-04-12 DIAGNOSIS — F329 Major depressive disorder, single episode, unspecified: Secondary | ICD-10-CM

## 2018-04-12 DIAGNOSIS — F32A Depression, unspecified: Secondary | ICD-10-CM

## 2018-04-12 LAB — MYOCARDIAL PERFUSION IMAGING
CHL CUP MPHR: 163 {beats}/min
CHL CUP NUCLEAR SSS: 12
CSEPEW: 8.6 METS
CSEPPHR: 141 {beats}/min
Exercise duration (min): 7 min
Exercise duration (sec): 30 s
LHR: 0.39
LV dias vol: 61 mL (ref 46–106)
LVSYSVOL: 21 mL
NUC STRESS TID: 1.16
Percent HR: 86 %
Rest HR: 85 {beats}/min
SDS: 1
SRS: 11

## 2018-04-12 MED ORDER — TECHNETIUM TC 99M TETROFOSMIN IV KIT
31.8000 | PACK | Freq: Once | INTRAVENOUS | Status: AC | PRN
  Administered 2018-04-12: 31.8 via INTRAVENOUS
  Filled 2018-04-12: qty 32

## 2018-04-12 MED ORDER — TECHNETIUM TC 99M TETROFOSMIN IV KIT
9.1000 | PACK | Freq: Once | INTRAVENOUS | Status: AC | PRN
  Administered 2018-04-12: 9.1 via INTRAVENOUS
  Filled 2018-04-12: qty 10

## 2018-04-12 NOTE — Progress Notes (Signed)
Name: Gabriella [place]   MRN: [id number]    DOB: Dec 19, 1960   Date:04/12/2018       Progress Note  Subjective  Chief Complaint Follow up  HPI Ms [place]ella [place] is here today for follow up, I first saw her on 03/27/18 to establish care and we discussed several complaints that she had. Since our first visit, she has seen cardiology and neurology for follow up as instructed, and is now awaiting upcoming stress testing and sleep evaluation.  Hypertension -her lisinopril-HCTZ was switched to losartan 50 daily at her last OV due to dizziness, lightheaded, lower BP readings Reports daily medication compliance without noted adverse medication effects. Reports she has not been checking her blood pressure readings since switching medications, but has noticed some improvement in her dizziness and lightheadedness. Denies headaches, vision changes, chest pain, shortness of breath, edema.  BP Readings from Last 3 Encounters:  04/12/18 110/78  03/29/18 126/64  03/29/18 (!) 132/93   Anxiety and depression-  At her last office visit, the following was noted: She tells me that she moved to [place] due to financial hardships she's been experiencing over the past 3 years, currently in court battle for monetary dispute with a Surveyor, minerals. She is also taking care of chronically ill mother here in [place]. She tells me that she frequently cries, feels she can't get her words out, and knows she is depressed. She was on celexa in the past for depression, did seem to help her some but never really felt fully better on the medication and has been off for some time. She denies SI, HI. She was then started on wellbutrin 100 daily at her neurology visit on 8/23 for stress with cognitive decompensation. She tells me today that she is feeling overall better, has noticed good improvement in her mood on wellbutrin, and has noticed some improvement in dizziness, lightheadedness and wonders if this was related to stress. She has not noticed any adverse  medication effects. She does tell me that she realized that she missed the wellbutrin  last week, forgot to put it on her pill box, but will restart today. She also scheduled first available appointment with counseling which is next month. Denies si, hi   Patient Active Problem List   Diagnosis Date Noted  . Essential hypertension 03/27/2018  . Extrinsic asthma 03/27/2018  . Depression 03/27/2018  . Chronic low back pain 03/27/2018    Past Surgical History:  Procedure Laterality Date  . ABDOMINAL HYSTERECTOMY  2003    Family History  Problem Relation Age of Onset  . Diabetes Mother   . Heart disease Mother   . Hyperlipidemia Mother   . Hypertension Mother   . Stroke Mother   . Diabetes Father   . Arthritis Father   . Heart disease Father   . Kidney disease Father   . Cancer Sister   . Diabetes Maternal Grandmother   . Heart disease Maternal Grandmother   . Hypertension Maternal Grandmother   . Diabetes Maternal Grandfather   . Heart disease Maternal Grandfather   . Hypertension Maternal Grandfather   . Diabetes Paternal Grandmother   . Heart disease Paternal Grandmother   . Hypertension Paternal Grandmother   . Diabetes Paternal Grandfather   . Heart disease Paternal Grandfather   . Hypertension Paternal Grandfather   . Hyperlipidemia Sister   . Bipolar disorder Sister     Social History   Socioeconomic History  . Marital status: Single    Spouse name: Not on file  .  Number of children: Not on file  . Years of education: Not on file  . Highest education level: Not on file  Occupational History  . Not on file  Social Needs  . Financial resource strain: Not on file  . Food insecurity:    Worry: Not on file    Inability: Not on file  . Transportation needs:    Medical: Not on file    Non-medical: Not on file  Tobacco Use  . Smoking status: Never Smoker  . Smokeless tobacco: Never Used  Substance and Sexual Activity  . Alcohol use: Yes  . Drug use:  Never  . Sexual activity: Not on file  Lifestyle  . Physical activity:    Days per week: Not on file    Minutes per session: Not on file  . Stress: Not on file  Relationships  . Social connections:    Talks on phone: Not on file    Gets together: Not on file    Attends religious service: Not on file    Active member of club or organization: Not on file    Attends meetings of clubs or organizations: Not on file    Relationship status: Not on file  . Intimate partner violence:    Fear of current or ex partner: Not on file    Emotionally abused: Not on file    Physically abused: Not on file    Forced sexual activity: Not on file  Other Topics Concern  . Not on file  Social History Narrative   Works as Company secretary     Current Outpatient Medications:  .  albuterol (PROVENTIL HFA;VENTOLIN HFA) 108 (90 Base) MCG/ACT inhaler, Inhale 2 puffs into the lungs every 6 (six) hours as needed for wheezing or shortness of breath., Disp: 1 Inhaler, Rfl: 0 .  aspirin EC 81 MG tablet, Take 1 tablet (81 mg total) by mouth daily., Disp: , Rfl:  .  buPROPion (WELLBUTRIN SR) 100 MG 12 hr tablet, Take 1 tablet (100 mg total) by mouth daily., Disp: 30 tablet, Rfl: 3 .  losartan (COZAAR) 50 MG tablet, Take 1 tablet (50 mg total) by mouth daily., Disp: 30 tablet, Rfl: 1 .  Multiple Vitamins-Minerals (ONE DAILY CALCIUM/IRON PO), Take 1 each by mouth daily., Disp: , Rfl:  .  Vitamin D3 (VITAMIN D) 25 MCG tablet, Take 1,000 Units by mouth daily., Disp: , Rfl:   Allergies  Allergen Reactions  . Sulfa Antibiotics      ROS See HPI  Objective  Vitals:   04/12/18 0759  BP: 110/78  Pulse: 81  SpO2: 96%  Weight: 191 lb (86.6 kg)  Height: 4\' 11"  (1.499 m)    Body mass index is 38.58 kg/m.  Physical Exam Vital signs reviewed. Constitutional: Patient appears well-developed and well-nourished. No distress.  HENT: Head: Normocephalic and atraumatic.  Nose: Nose normal. Mouth/Throat:  Oropharynx is clear and moist. No oropharyngeal exudate.  Eyes: Conjunctivae and EOM are normal. Pupils are equal, round, and reactive to light. No scleral icterus.  Neck: Normal range of motion. Neck supple.  Cardiovascular: Normal rate, regular rhythm and normal heart sounds.  No murmur heard. No BLE edema. Distal pulses intact. Pulmonary/Chest: Effort normal and breath sounds normal. No respiratory distress. Neurological: She is alert and oriented to person, place, and time. No cranial nerve deficit. Coordination, balance, strength, speech and gait are normal.  Skin: Skin is warm and dry. No rash noted. No erythema.  Psychiatric: Patient has a normal  mood and affect. behavior is normal. Judgment and thought content normal.   Assessment & Plan RTC in 3 months for F/U: HTN- recheck BP, Anxiety and depression-follow up  -Reviewed Health Maintenance: will review HM once acute problems are stable

## 2018-04-12 NOTE — Assessment & Plan Note (Signed)
BP appears stable on losartan, continue current dosage RTC in 3 months for F/U- recheck BP

## 2018-04-12 NOTE — Assessment & Plan Note (Signed)
Improving on wellbutrin, continue current dosage, discussed daily medication compliance for optimal medication benefit  Continue follow up with psychology for counseling as scheduled RTC in 3 months for follow up, or sooner if needed Additional education provided on AVS

## 2018-04-12 NOTE — Patient Instructions (Signed)
I am glad to see you are feeling better.  We will plan to meet back in about 3 months for follow up, or sooner if needed.   Stress and Stress Management Stress is a normal reaction to life events. It is what you feel when life demands more than you are used to or more than you can handle. Some stress can be useful. For example, the stress reaction can help you catch the last bus of the day, study for a test, or meet a deadline at work. But stress that occurs too often or for too long can cause problems. It can affect your emotional health and interfere with relationships and normal daily activities. Too much stress can weaken your immune system and increase your risk for physical illness. If you already have a medical problem, stress can make it worse. What are the causes? All sorts of life events may cause stress. An event that causes stress for one person may not be stressful for another person. Major life events commonly cause stress. These may be positive or negative. Examples include losing your job, moving into a new home, getting married, having a baby, or losing a loved one. Less obvious life events may also cause stress, especially if they occur day after day or in combination. Examples include working long hours, driving in traffic, caring for children, being in debt, or being in a difficult relationship. What are the signs or symptoms? Stress may cause emotional symptoms including, the following:  Anxiety. This is feeling worried, afraid, on edge, overwhelmed, or out of control.  Anger. This is feeling irritated or impatient.  Depression. This is feeling sad, down, helpless, or guilty.  Difficulty focusing, remembering, or making decisions.  Stress may cause physical symptoms, including the following:  Aches and pains. These may affect your head, neck, back, stomach, or other areas of your body.  Tight muscles or clenched jaw.  Low energy or trouble sleeping.  Stress may cause  unhealthy behaviors, including the following:  Eating to feel better (overeating) or skipping meals.  Sleeping too little, too much, or both.  Working too much or putting off tasks (procrastination).  Smoking, drinking alcohol, or using drugs to feel better.  How is this diagnosed? Stress is diagnosed through an assessment by your health care provider. Your health care provider will ask questions about your symptoms and any stressful life events.Your health care provider will also ask about your medical history and may order blood tests or other tests. Certain medical conditions and medicine can cause physical symptoms similar to stress. Mental illness can cause emotional symptoms and unhealthy behaviors similar to stress. Your health care provider may refer you to a mental health professional for further evaluation. How is this treated? Stress management is the recommended treatment for stress.The goals of stress management are reducing stressful life events and coping with stress in healthy ways. Techniques for reducing stressful life events include the following:  Stress identification. Self-monitor for stress and identify what causes stress for you. These skills may help you to avoid some stressful events.  Time management. Set your priorities, keep a calendar of events, and learn to say "no." These tools can help you avoid making too many commitments.  Techniques for coping with stress include the following:  Rethinking the problem. Try to think realistically about stressful events rather than ignoring them or overreacting. Try to find the positives in a stressful situation rather than focusing on the negatives.  Exercise. Physical exercise can  release both physical and emotional tension. The key is to find a form of exercise you enjoy and do it regularly.  Relaxation techniques. These relax the body and mind. Examples include yoga, meditation, tai chi, biofeedback, deep breathing,  progressive muscle relaxation, listening to music, being out in nature, journaling, and other hobbies. Again, the key is to find one or more that you enjoy and can do regularly.  Healthy lifestyle. Eat a balanced diet, get plenty of sleep, and do not smoke. Avoid using alcohol or drugs to relax.  Strong support network. Spend time with family, friends, or other people you enjoy being around.Express your feelings and talk things over with someone you trust.  Counseling or talktherapy with a mental health professional may be helpful if you are having difficulty managing stress on your own. Medicine is typically not recommended for the treatment of stress.Talk to your health care provider if you think you need medicine for symptoms of stress. Follow these instructions at home:  Keep all follow-up visits as directed by your health care provider.  Take all medicines as directed by your health care provider. Contact a health care provider if:  Your symptoms get worse or you start having new symptoms.  You feel overwhelmed by your problems and can no longer manage them on your own. Get help right away if:  You feel like hurting yourself or someone else. This information is not intended to replace advice given to you by your health care provider. Make sure you discuss any questions you have with your health care provider. Document Released: 01/17/2001 Document Revised: 12/30/2015 Document Reviewed: 03/18/2013 Elsevier Interactive Patient Education  2017 [employer].

## 2018-04-15 ENCOUNTER — Encounter (HOSPITAL_COMMUNITY): Payer: Self-pay

## 2018-04-18 ENCOUNTER — Other Ambulatory Visit: Payer: Self-pay | Admitting: Nurse Practitioner

## 2018-04-18 DIAGNOSIS — I10 Essential (primary) hypertension: Secondary | ICD-10-CM

## 2018-04-20 ENCOUNTER — Other Ambulatory Visit: Payer: Self-pay | Admitting: Neurology

## 2018-04-23 ENCOUNTER — Telehealth: Payer: Self-pay | Admitting: *Deleted

## 2018-04-23 ENCOUNTER — Other Ambulatory Visit: Payer: Self-pay | Admitting: Family

## 2018-04-23 MED ORDER — OLMESARTAN MEDOXOMIL 20 MG PO TABS: 20.0000 mg | ORAL_TABLET | Freq: Every day | ORAL | 1 refills | Status: DC

## 2018-04-23 NOTE — Telephone Encounter (Signed)
Sent in Benicar 20 mg daily; needs blood pressure check within 1 month to make sure tolerating okay; could do nurse visit-

## 2018-04-23 NOTE — Telephone Encounter (Signed)
Pt informed of below. She wants to call back and schedule a nurse visit.

## 2018-04-23 NOTE — Telephone Encounter (Signed)
Losartan is on back order. Pharmacy states Olmesartan is available. Please advise in PCP's absence. Thanks!

## 2018-05-03 ENCOUNTER — Ambulatory Visit: Payer: Federal, State, Local not specified - PPO | Admitting: Psychology

## 2018-05-03 DIAGNOSIS — F4323 Adjustment disorder with mixed anxiety and depressed mood: Secondary | ICD-10-CM | POA: Diagnosis not present

## 2018-05-10 DIAGNOSIS — H524 Presbyopia: Secondary | ICD-10-CM | POA: Diagnosis not present

## 2018-05-10 DIAGNOSIS — H40023 Open angle with borderline findings, high risk, bilateral: Secondary | ICD-10-CM | POA: Diagnosis not present

## 2018-05-10 DIAGNOSIS — H5213 Myopia, bilateral: Secondary | ICD-10-CM | POA: Diagnosis not present

## 2018-05-10 DIAGNOSIS — H2513 Age-related nuclear cataract, bilateral: Secondary | ICD-10-CM | POA: Diagnosis not present

## 2018-05-20 ENCOUNTER — Other Ambulatory Visit: Payer: Self-pay | Admitting: Nurse Practitioner

## 2018-05-20 NOTE — Telephone Encounter (Signed)
Copied from CRM [id number]. Topic: Quick Communication - Rx Refill/Question >> May 20, 2018  2:36 PM Gerrianne Scale wrote: Medication: olmesartan (BENICAR) 20 MG tablet  90 day refill  Has the patient contacted their pharmacy? Yes.  Thursday or Friday (Agent: If no, request that the patient contact the pharmacy for the refill.) (Agent: If yes, when and what did the pharmacy advise?) haven't heard from provider  Preferred Pharmacy (with phone number or street name):     [id number] [place], [place] - [address] AT [phone] (Phone) [phone] (Fax)    Agent: Please be advised that RX refills may take up to 3 business days. We ask that you follow-up with your pharmacy.

## 2018-05-21 MED ORDER — OLMESARTAN MEDOXOMIL 20 MG PO TABS: 20.0000 mg | ORAL_TABLET | Freq: Every day | ORAL | 0 refills | Status: DC

## 2018-05-21 NOTE — Telephone Encounter (Signed)
Requested Prescriptions  Pending Prescriptions Disp Refills  . olmesartan (BENICAR) 20 MG tablet 90 tablet 0    Sig: Take 1 tablet (20 mg total) by mouth daily.     Cardiovascular:  Angiotensin Receptor Blockers Passed - 05/20/2018  3:04 PM      Passed - Cr in normal range and within 180 days    Creatinine, Ser  Date Value Ref Range Status  03/27/2018 0.76 0.40 - 1.20 mg/dL Final         Passed - K in normal range and within 180 days    Potassium  Date Value Ref Range Status  03/27/2018 3.7 3.5 - 5.1 mEq/L Final         Passed - Patient is not pregnant      Passed - Last BP in normal range    BP Readings from Last 1 Encounters:  04/12/18 110/78         Passed - Valid encounter within last 6 months    Recent Outpatient Visits          1 month ago Essential hypertension   [facility], Audie Box, NP   1 month ago Confusion   [facility] [facility], Audie Box, NP      Future Appointments            In 1 month [place], Audie Box, NP [facility] -[place], [place]

## 2018-05-23 ENCOUNTER — Institutional Professional Consult (permissible substitution): Payer: Federal, State, Local not specified - PPO | Admitting: Neurology

## 2018-06-14 ENCOUNTER — Ambulatory Visit (INDEPENDENT_AMBULATORY_CARE_PROVIDER_SITE_OTHER): Payer: Federal, State, Local not specified - PPO | Admitting: Psychology

## 2018-06-14 DIAGNOSIS — F4323 Adjustment disorder with mixed anxiety and depressed mood: Secondary | ICD-10-CM | POA: Diagnosis not present

## 2018-07-12 ENCOUNTER — Ambulatory Visit: Payer: Federal, State, Local not specified - PPO | Admitting: Nurse Practitioner

## 2018-07-12 ENCOUNTER — Encounter: Payer: Self-pay | Admitting: Nurse Practitioner

## 2018-07-12 ENCOUNTER — Ambulatory Visit (INDEPENDENT_AMBULATORY_CARE_PROVIDER_SITE_OTHER)
Admission: RE | Admit: 2018-07-12 | Discharge: 2018-07-12 | Disposition: A | Discharge status: Home / Self Care | Payer: Federal, State, Local not specified - PPO | Source: Ambulatory Visit | Attending: Nurse Practitioner | Admitting: Nurse Practitioner

## 2018-07-12 ENCOUNTER — Other Ambulatory Visit (INDEPENDENT_AMBULATORY_CARE_PROVIDER_SITE_OTHER): Payer: Federal, State, Local not specified - PPO

## 2018-07-12 VITALS — BP 130/80 | HR 85 | Ht 59.0 in | Wt 196.0 lb

## 2018-07-12 DIAGNOSIS — Z9189 Other specified personal risk factors, not elsewhere classified: Secondary | ICD-10-CM

## 2018-07-12 DIAGNOSIS — G8929 Other chronic pain: Secondary | ICD-10-CM

## 2018-07-12 DIAGNOSIS — M5441 Lumbago with sciatica, right side: Secondary | ICD-10-CM | POA: Diagnosis not present

## 2018-07-12 DIAGNOSIS — I10 Essential (primary) hypertension: Secondary | ICD-10-CM

## 2018-07-12 DIAGNOSIS — Z23 Encounter for immunization: Secondary | ICD-10-CM | POA: Diagnosis not present

## 2018-07-12 DIAGNOSIS — M545 Low back pain: Secondary | ICD-10-CM | POA: Diagnosis not present

## 2018-07-12 DIAGNOSIS — F329 Major depressive disorder, single episode, unspecified: Secondary | ICD-10-CM

## 2018-07-12 DIAGNOSIS — R200 Anesthesia of skin: Secondary | ICD-10-CM | POA: Diagnosis not present

## 2018-07-12 DIAGNOSIS — Z1159 Encounter for screening for other viral diseases: Secondary | ICD-10-CM

## 2018-07-12 DIAGNOSIS — F32A Depression, unspecified: Secondary | ICD-10-CM

## 2018-07-12 LAB — BASIC METABOLIC PANEL
BUN: 15 mg/dL (ref 6–23)
CO2: 28 mEq/L (ref 19–32)
Calcium: 9.4 mg/dL (ref 8.4–10.5)
Chloride: 104 mEq/L (ref 96–112)
Creatinine, Ser: 0.8 mg/dL (ref 0.40–1.20)
GFR: 94.96 mL/min (ref >60.00)
Glucose, Bld: 101 mg/dL — ABNORMAL HIGH (ref 70–99)
Potassium: 3.8 mEq/L (ref 3.5–5.1)
Sodium: 140 mEq/L (ref 135–145)

## 2018-07-12 NOTE — Assessment & Plan Note (Signed)
Improving Continue wellbutrin at current dosage F/U for new, wrosening symptoms

## 2018-07-12 NOTE — Progress Notes (Signed)
Gabriella Porter is a 57 y.o. female with the following history as recorded in Epic[facility]:  Patient Active Problem List   Diagnosis Date Noted  . Essential hypertension 03/27/2018  . Extrinsic asthma 03/27/2018  . Depression 03/27/2018  . Chronic low back pain 03/27/2018    Current Outpatient Medications  Medication Sig Dispense Refill  . albuterol (PROVENTIL HFA;VENTOLIN HFA) 108 (90 Base) MCG/ACT inhaler Inhale 2 puffs into the lungs every 6 (six) hours as needed for wheezing or shortness of breath. 1 Inhaler 0  . aspirin EC 81 MG tablet Take 1 tablet (81 mg total) by mouth daily.    Marland Kitchen buPROPion (WELLBUTRIN SR) 100 MG 12 hr tablet TAKE 1 TABLET BY MOUTH EVERY DAY 90 tablet 2  . Multiple Vitamins-Minerals (ONE DAILY CALCIUM/IRON PO) Take 1 each by mouth daily.    Marland Kitchen olmesartan (BENICAR) 20 MG tablet Take 1 tablet (20 mg total) by mouth daily. 90 tablet 0  . Vitamin D3 (VITAMIN D) 25 MCG tablet Take 1,000 Units by mouth daily.     No current facility-administered medications for this visit.     Allergies: Sulfa antibiotics  Past Medical History:  Diagnosis Date  . Arthritis   . Asthma   . Depression   . GERD (gastroesophageal reflux disease)   . History of recurrent UTI (urinary tract infection)   . Hypertension     Past Surgical History:  Procedure Laterality Date  . ABDOMINAL HYSTERECTOMY  2003    Family History  Problem Relation Age of Onset  . Diabetes Mother   . Heart disease Mother   . Hyperlipidemia Mother   . Hypertension Mother   . Stroke Mother   . Diabetes Father   . Arthritis Father   . Heart disease Father   . Kidney disease Father   . Cancer Sister   . Diabetes Maternal Grandmother   . Heart disease Maternal Grandmother   . Hypertension Maternal Grandmother   . Diabetes Maternal Grandfather   . Heart disease Maternal Grandfather   . Hypertension Maternal Grandfather   . Diabetes Paternal Grandmother   . Heart disease Paternal Grandmother   . Hypertension  Paternal Grandmother   . Diabetes Paternal Grandfather   . Heart disease Paternal Grandfather   . Hypertension Paternal Grandfather   . Hyperlipidemia Sister   . Bipolar disorder Sister     Social History   Tobacco Use  . Smoking status: Never Smoker  . Smokeless tobacco: Never Used  Substance Use Topics  . Alcohol use: Yes     Subjective:  Gabriella Porter is here today for follow up of HTN, anxiety/depression. She also wants to discuss complaints of back pain and hand numbness today  Hypertension -was switched from losartan 50 to olmesartan 20 around September due to pharmacy request, she was asked to come in for a nurse visit in about 1 month to recheck bp after med adjustment but just returns today Reports daily medication compliance without noted adverse medication effects. Reports she does not regularly check her blood pressure readings No syncope, vision changes, chest pain, shortness of breath, edema.  BP Readings from Last 3 Encounters:  07/12/18 130/80  04/12/18 110/78  03/29/18 126/64    Anxiety and depression- she was started on welbutrin 100 q 12hours around August for uncontrolled anxiety and depression, was feeling better on Wellbutrin, felt it was helping her mood,  at last OV with me in early September but admitted that she had been forgetting to take medication daily.  Today, she tells me she has now been taking medication daily as instructed, has not noted adverse effects, and continues to feel her mood is improving, her financial situation is getting better which is helping. She has continued counseling but might stop going soon, feeling stable and that she might not need counseling anymore.  Left Hand pain and  numbness- this is not a new problem for her, she says this has been occurring for about 10 years now since she had carpal tunnel surgery, the numbness is intermittent, sometimes wakes her from sleep at night, she has to shake her hand to get the feeling back. No  vision or speech changes, neck pain, confusion, weakness. This has not worsened over time  Back pain- this is not a new problem for her, she says she has experienced aching pain in lower back for years, pain is constant every day, sometimes radiates down her right leg, worse after activity, such as working in the yard, was told to do exercises for this in the past but admits she does not do exercises regularly. This has not worsened over time  ROS- See HPI  Objective:  Vitals:   07/12/18 0759  BP: 130/80  Pulse: 85  SpO2: 97%  Weight: 196 lb (88.9 kg)  Height: 4\' 11"  (1.499 m)    General: Well developed, well nourished, in no acute distress  Skin : Warm and dry.  Head: Normocephalic and atraumatic  Eyes: Sclera and conjunctiva clear; pupils round and reactive to light; extraocular movements intact  Oropharynx: Pink, supple. No suspicious lesions  Neck: Supple Lungs: Respirations unlabored; clear to auscultation bilaterally without wheeze, rales, rhonchi  CVS exam: normal rate, regular rhythm, normal S1, S2, no murmurs, rubs, clicks or gallops.  Musculoskeletal: No deformities; no active joint inflammation  Extremities: No edema, cyanosis, clubbing  Vessels: Symmetric bilaterally  Neurologic: Alert and oriented; speech intact; face symmetrical; moves all extremities well; CNII-XII intact without focal deficit  Psychiatric: Normal mood and affect.   Assessment:  1. Essential hypertension   2. Depression, unspecified depression type   3. Chronic right-sided low back pain with right-sided sciatica   4. Hand numbness   5. Encounter for hepatitis C virus screening test for high risk patient   6. Need for Tdap vaccination     Plan:   Return in about 3 months (around 10/11/2018) for CPE, bp recheck.  Orders Placed This Encounter  Procedures  . Tdap vaccine greater than or equal to 7yo IM  . Basic metabolic panel    Standing Status:   Future    Standing Expiration Date:   07/13/2019   . Hepatitis C antibody    Standing Status:   Future    Standing Expiration Date:   08/12/2018    Requested Prescriptions    No prescriptions requested or ordered in this encounter   Reviewed Health Maintenance: Encounter for hepatitis C virus screening test for high risk patient- Hepatitis C antibody; Future Need for Tdap vaccination- Tdap vaccine greater than or equal to 7yo IM  Hand numbness Mg, B12, TSH, A1c WNL 03/18/18 This is not an acute problem, Suspect this could be related to prior Dx of carpal tunnel We discussed referral to sports medicine for further evaluation, she is agreeable Home management, red flags and return precautions including when to seek immediate care discussed and printed on AVS

## 2018-07-12 NOTE — Assessment & Plan Note (Signed)
BP appears stable on olmesartan, continue RTC in 3 months for F/U- recheck BP - Basic metabolic panel; Future

## 2018-07-12 NOTE — Patient Instructions (Addendum)
Head downstairs for lab work Gabriella Porter  I will see you back in about 3 months to recheck your blood pressure, we can do your annual physical that day if youd like   Back Exercises If you have pain in your back, do these exercises 2-3 times each day or as told by your doctor. When the pain goes away, do the exercises once each day, but repeat the steps more times for each exercise (do more repetitions). If you do not have pain in your back, do these exercises once each day or as told by your doctor. Exercises Single Knee to Chest  Do these steps 3-5 times in a row for each leg: 1. Lie on your back on a firm bed or the floor with your legs stretched out. 2. Bring one knee to your chest. 3. Hold your knee to your chest by grabbing your knee or thigh. 4. Pull on your knee until you feel a gentle stretch in your lower back. 5. Keep doing the stretch for 10-30 seconds. 6. Slowly let go of your leg and straighten it.  Pelvic Tilt  Do these steps 5-10 times in a row: 1. Lie on your back on a firm bed or the floor with your legs stretched out. 2. Bend your knees so they point up to the ceiling. Your feet should be flat on the floor. 3. Tighten your lower belly (abdomen) muscles to press your lower back against the floor. This will make your tailbone point up to the ceiling instead of pointing down to your feet or the floor. 4. Stay in this position for 5-10 seconds while you gently tighten your muscles and breathe evenly.  Cat-Cow  Do these steps until your lower back bends more easily: 1. Get on your hands and knees on a firm surface. Keep your hands under your shoulders, and keep your knees under your hips. You may put padding under your knees. 2. Let your head hang down, and make your tailbone point down to the floor so your lower back is round like the back of a cat. 3. Stay in this position for 5 seconds. 4. Slowly lift your head and make your tailbone point up to the ceiling so your back  hangs low (sags) like the back of a cow. 5. Stay in this position for 5 seconds.  Press-Ups  Do these steps 5-10 times in a row: 1. Lie on your belly (face-down) on the floor. 2. Place your hands near your head, about shoulder-width apart. 3. While you keep your back relaxed and keep your hips on the floor, slowly straighten your arms to raise the top half of your body and lift your shoulders. Do not use your back muscles. To make yourself more comfortable, you may change where you place your hands. 4. Stay in this position for 5 seconds. 5. Slowly return to lying flat on the floor.  Bridges  Do these steps 10 times in a row: 1. Lie on your back on a firm surface. 2. Bend your knees so they point up to the ceiling. Your feet should be flat on the floor. 3. Tighten your butt muscles and lift your butt off of the floor until your waist is almost as high as your knees. If you do not feel the muscles working in your butt and the back of your thighs, slide your feet 1-2 inches farther away from your butt. 4. Stay in this position for 3-5 seconds. 5. Slowly lower your butt to  the floor, and let your butt muscles relax.  If this exercise is too easy, try doing it with your arms crossed over your chest. Belly Crunches  Do these steps 5-10 times in a row: 1. Lie on your back on a firm bed or the floor with your legs stretched out. 2. Bend your knees so they point up to the ceiling. Your feet should be flat on the floor. 3. Cross your arms over your chest. 4. Tip your chin a little bit toward your chest but do not bend your neck. 5. Tighten your belly muscles and slowly raise your chest just enough to lift your shoulder blades a tiny bit off of the floor. 6. Slowly lower your chest and your head to the floor.  Back Lifts Do these steps 5-10 times in a row: 1. Lie on your belly (face-down) with your arms at your sides, and rest your forehead on the floor. 2. Tighten the muscles in your legs  and your butt. 3. Slowly lift your chest off of the floor while you keep your hips on the floor. Keep the back of your head in line with the curve in your back. Look at the floor while you do this. 4. Stay in this position for 3-5 seconds. 5. Slowly lower your chest and your face to the floor.  Contact a doctor if:  Your back pain gets a lot worse when you do an exercise.  Your back pain does not lessen 2 hours after you exercise. If you have any of these problems, stop doing the exercises. Do not do them again unless your doctor says it is okay. Get help right away if:  You have sudden, very bad back pain. If this happens, stop doing the exercises. Do not do them again unless your doctor says it is okay. This information is not intended to replace advice given to you by your health care provider. Make sure you discuss any questions you have with your health care provider. Document Released: 08/26/2010 Document Revised: 12/30/2015 Document Reviewed: 09/17/2014 Elsevier Interactive Patient Education  [employer]2018 [employer]

## 2018-07-12 NOTE — Assessment & Plan Note (Signed)
No recent imaging done per patient, will update imaging today We discussed referral to sports medicine, will follow up with xray results Home management, red flags and return precautions including when to seek immediate care discussed and printed on AVS - DG Lumbar Spine Complete; Future

## 2018-07-13 LAB — HEPATITIS C ANTIBODY
HEP C AB: NONREACTIVE
SIGNAL TO CUT-OFF: 0.02 (ref <1.00)

## 2018-07-16 ENCOUNTER — Telehealth: Payer: Self-pay | Admitting: Nurse Practitioner

## 2018-07-16 NOTE — Telephone Encounter (Signed)
Copied from CRM [id number]. Topic: Quick Communication - Other Results (Clinic Use ONLY) >> Jul 15, 2018  4:26 PM Merrilyn PumaSimmons, Stacey N, CMA wrote: Left message for pt to call back Re: recent x ray.  [facility] may inform patient of results.  Pt calling back for xray results. Please call back.

## 2018-07-16 NOTE — Telephone Encounter (Signed)
Contacted pt. To give xray results; see result note.

## 2018-07-19 ENCOUNTER — Ambulatory Visit: Payer: Federal, State, Local not specified - PPO | Admitting: Psychology

## 2018-07-19 ENCOUNTER — Ambulatory Visit: Payer: Federal, State, Local not specified - PPO | Admitting: Family Medicine

## 2018-07-19 ENCOUNTER — Encounter: Payer: Self-pay | Admitting: Family Medicine

## 2018-07-19 VITALS — BP 132/70 | HR 99 | Resp 16 | Wt 198.0 lb

## 2018-07-19 DIAGNOSIS — M5441 Lumbago with sciatica, right side: Secondary | ICD-10-CM

## 2018-07-19 DIAGNOSIS — M79642 Pain in left hand: Secondary | ICD-10-CM

## 2018-07-19 DIAGNOSIS — G8929 Other chronic pain: Secondary | ICD-10-CM

## 2018-07-19 DIAGNOSIS — M25562 Pain in left knee: Secondary | ICD-10-CM

## 2018-07-19 DIAGNOSIS — M25561 Pain in right knee: Secondary | ICD-10-CM | POA: Diagnosis not present

## 2018-07-19 MED ORDER — DICLOFENAC SODIUM 2 % TD SOLN: 1.0000 "application " | Freq: Two times a day (BID) | TRANSDERMAL | 3 refills | Status: DC

## 2018-07-19 MED ORDER — GABAPENTIN 100 MG PO CAPS: 100.0000 mg | ORAL_CAPSULE | Freq: Three times a day (TID) | ORAL | 0 refills | Status: DC

## 2018-07-19 MED ORDER — IBUPROFEN-FAMOTIDINE 800-26.6 MG PO TABS: 1.0000 | ORAL_TABLET | Freq: Three times a day (TID) | ORAL | 3 refills | Status: DC

## 2018-07-19 NOTE — Patient Instructions (Signed)
Nice to meet you  Please use the brace at night  Please try the exercises  Please work on your posture at work  Please try to stop and take breaks on your drive  Please try physical therapy  Please see me back in 3-4 weeks if no better.  Happy Holidays.

## 2018-07-19 NOTE — Progress Notes (Signed)
Gabriella Porter - 57 y.o. female MRN [id number]  Date of birth: [birth year]  SUBJECTIVE:  Including CC & ROS.  No chief complaint on file.   Gabriella Porter is a 57 y.o. female that is presenting with low back pain, left hand pain and bilateral knee pain.  Acute on chronic low back pain that is occurring in the lower back with some radicular symptoms on the right side.  She feels like the symptoms ongoing for about a year.  Denies any weakness or changes in sensation.  No improvement with home modalities.  Symptoms are moderate in severity.  Symptoms are intermittent.  They are worse after sitting for long periods of time.  She is also having left hand pain.  She has a history of carpal tunnel surgery.  She feels like the sensation changes are intermittent.  She seems like these are worse at night.  She has not had any grip changes.  Bilateral knee pain is anterior nature.  Denies any locking or giving way.  Pain is moderate in severity.  Denies any injury or inciting event.  Pain is worse with going downstairs.  Pain is localized to the knee.  No improvement with modalities tried to date..  Independent review of the lumbar x-ray from 12/6 shows a anterior listhesis at L5-S1.  Review of Systems  Constitutional: Negative for fever.  HENT: Negative for congestion.   Respiratory: Negative for cough.   Cardiovascular: Negative for chest pain.  Gastrointestinal: Negative for abdominal pain.  Musculoskeletal: Positive for back pain.  Skin: Negative for color change.  Neurological: Positive for numbness.  Hematological: Negative for adenopathy.  Psychiatric/Behavioral: Negative for agitation.    HISTORY: Past Medical, Surgical, Social, and Family History Reviewed & Updated per EMR.   Pertinent Historical Findings include:  Past Medical History:  Diagnosis Date  . Arthritis   . Asthma   . Depression   . GERD (gastroesophageal reflux disease)   . History of recurrent UTI (urinary tract  infection)   . Hypertension     Past Surgical History:  Procedure Laterality Date  . ABDOMINAL HYSTERECTOMY  2003    Allergies  Allergen Reactions  . Sulfa Antibiotics     Family History  Problem Relation Age of Onset  . Diabetes Mother   . Heart disease Mother   . Hyperlipidemia Mother   . Hypertension Mother   . Stroke Mother   . Diabetes Father   . Arthritis Father   . Heart disease Father   . Kidney disease Father   . Cancer Sister   . Diabetes Maternal Grandmother   . Heart disease Maternal Grandmother   . Hypertension Maternal Grandmother   . Diabetes Maternal Grandfather   . Heart disease Maternal Grandfather   . Hypertension Maternal Grandfather   . Diabetes Paternal Grandmother   . Heart disease Paternal Grandmother   . Hypertension Paternal Grandmother   . Diabetes Paternal Grandfather   . Heart disease Paternal Grandfather   . Hypertension Paternal Grandfather   . Hyperlipidemia Sister   . Bipolar disorder Sister      Social History   Socioeconomic History  . Marital status: Single    Spouse name: Not on file  . Number of children: Not on file  . Years of education: Not on file  . Highest education level: Not on file  Occupational History  . Not on file  Social Needs  . Financial resource strain: Not on file  . Food insecurity:    Worry:  Not on file    Inability: Not on file  . Transportation needs:    Medical: Not on file    Non-medical: Not on file  Tobacco Use  . Smoking status: Never Smoker  . Smokeless tobacco: Never Used  Substance and Sexual Activity  . Alcohol use: Yes  . Drug use: Never  . Sexual activity: Not on file  Lifestyle  . Physical activity:    Days per week: Not on file    Minutes per session: Not on file  . Stress: Not on file  Relationships  . Social connections:    Talks on phone: Not on file    Gets together: Not on file    Attends religious service: Not on file    Active member of club or organization:  Not on file    Attends meetings of clubs or organizations: Not on file    Relationship status: Not on file  . Intimate partner violence:    Fear of current or ex partner: Not on file    Emotionally abused: Not on file    Physically abused: Not on file    Forced sexual activity: Not on file  Other Topics Concern  . Not on file  Social History Narrative   Works as Company secretaryinvestigative analyst     PHYSICAL EXAM:  VS: BP 132/70   Pulse 99   Resp 16   Wt 198 lb (89.8 kg)   LMP 08/07/2001   SpO2 98%   BMI 39.99 kg/m  Physical Exam Gen: NAD, alert, cooperative with exam, well-appearing ENT: normal lips, normal nasal mucosa,  Eye: normal EOM, normal conjunctiva and lids CV:  no edema, +2 pedal pulses   Resp: no accessory muscle use, non-labored,   Skin: no rashes, no areas of induration  Neuro: normal tone, normal sensation to touch Psych:  normal insight, alert and oriented MSK:  Back:  No tenderness palpation of the greater trochanter or SI joints. Some tenderness palpation in the paraspinal muscles in the left and right. No tenderness to palpation of the midline lumbar spine. Normal internal and external rotation of the hips. Normal strength resistance with hip flexion. Right and left knee: No obvious effusion. No tenderness palpation over the medial lateral joint line. Normal range of motion. Normal strength resistance. Some pain with patellar grind. Left hand: No signs of atrophy. Normal range of motion. Normal strength resistance with finger abduction and abduction, thumb extension. Normal pincer grasp. Normal grip strength. Mildly positive Tinel's at the wrist. Neurovascular intact     ASSESSMENT & PLAN:   Chronic low back pain Has a slip on x-ray that is likely contributing.  And also postural in nature.  [place]  -Gabapentin. -Counseled on home exercise therapy and supportive care. -Counseled on posture and taking breaks away  from sitting. -Referral to physical therapy. -If no improvement consider further imaging  Chronic pain of both knees Pain likely patellofemoral in nature.  Again likely related to her long drive with work. -Pennsaid. -Duexis. -Counseled on home exercise therapy and supportive care -If no improvement consider imaging or injection   Left hand pain Reports to having history of carpal tunnel surgery.  Unclear if this is the same pain that she has described in the past. -Counseled on home exercise therapy. -Pennsaid. -If no improvement can consider injection

## 2018-07-22 DIAGNOSIS — M25561 Pain in right knee: Secondary | ICD-10-CM

## 2018-07-22 DIAGNOSIS — M25562 Pain in left knee: Secondary | ICD-10-CM

## 2018-07-22 DIAGNOSIS — M79642 Pain in left hand: Secondary | ICD-10-CM | POA: Insufficient documentation

## 2018-07-22 DIAGNOSIS — G8929 Other chronic pain: Secondary | ICD-10-CM | POA: Insufficient documentation

## 2018-07-22 NOTE — Assessment & Plan Note (Addendum)
Has a slip on x-ray that is likely contributing.  And also postural in nature.  [place]  -Gabapentin. -Counseled on home exercise therapy and supportive care. -Counseled on posture and taking breaks away from sitting. -Referral to physical therapy. -If no improvement consider further imaging

## 2018-07-22 NOTE — Assessment & Plan Note (Signed)
Pain likely patellofemoral in nature.  Again likely related to her long drive with work. -Pennsaid. -Duexis. -Counseled on home exercise therapy and supportive care -If no improvement consider imaging or injection

## 2018-07-22 NOTE — Assessment & Plan Note (Signed)
Reports to having history of carpal tunnel surgery.  Unclear if this is the same pain that she has described in the past. -Counseled on home exercise therapy. -Pennsaid. -If no improvement can consider injection

## 2018-08-19 ENCOUNTER — Telehealth: Payer: Self-pay | Admitting: Nurse Practitioner

## 2018-08-19 NOTE — Telephone Encounter (Signed)
Copied from CRM [id number]. Topic: Quick Communication - See Telephone Encounter >> Aug 19, 2018 11:43 AM Jens Som A wrote: CRM for notification. See Telephone encounter for: 08/19/18.  Jon Gills is calling from [employer] requesting PA for [employer]. Please advise  [phone]

## 2018-08-19 NOTE — Telephone Encounter (Signed)
Do you have some samples we can give her?

## 2018-08-30 DIAGNOSIS — H2513 Age-related nuclear cataract, bilateral: Secondary | ICD-10-CM | POA: Diagnosis not present

## 2018-08-30 DIAGNOSIS — H5359 Other color vision deficiencies: Secondary | ICD-10-CM | POA: Diagnosis not present

## 2018-08-30 DIAGNOSIS — H40023 Open angle with borderline findings, high risk, bilateral: Secondary | ICD-10-CM | POA: Diagnosis not present

## 2018-08-30 DIAGNOSIS — H53453 Other localized visual field defect, bilateral: Secondary | ICD-10-CM | POA: Diagnosis not present

## 2018-09-16 ENCOUNTER — Encounter: Payer: Self-pay | Admitting: Neurology

## 2018-09-27 ENCOUNTER — Ambulatory Visit: Payer: Federal, State, Local not specified - PPO | Admitting: Physical Therapy

## 2018-10-04 ENCOUNTER — Other Ambulatory Visit: Payer: Self-pay

## 2018-10-04 ENCOUNTER — Ambulatory Visit: Payer: Federal, State, Local not specified - PPO | Attending: Family Medicine | Admitting: Physical Therapy

## 2018-10-04 ENCOUNTER — Encounter: Payer: Self-pay | Admitting: Physical Therapy

## 2018-10-04 DIAGNOSIS — M545 Low back pain: Secondary | ICD-10-CM | POA: Diagnosis not present

## 2018-10-04 DIAGNOSIS — H401222 Low-tension glaucoma, left eye, moderate stage: Secondary | ICD-10-CM | POA: Diagnosis not present

## 2018-10-04 DIAGNOSIS — M6281 Muscle weakness (generalized): Secondary | ICD-10-CM

## 2018-10-04 DIAGNOSIS — H409 Unspecified glaucoma: Secondary | ICD-10-CM

## 2018-10-04 DIAGNOSIS — H53453 Other localized visual field defect, bilateral: Secondary | ICD-10-CM | POA: Diagnosis not present

## 2018-10-04 DIAGNOSIS — M62830 Muscle spasm of back: Secondary | ICD-10-CM | POA: Diagnosis not present

## 2018-10-04 DIAGNOSIS — H401212 Low-tension glaucoma, right eye, moderate stage: Secondary | ICD-10-CM | POA: Diagnosis not present

## 2018-10-04 DIAGNOSIS — G8929 Other chronic pain: Secondary | ICD-10-CM | POA: Insufficient documentation

## 2018-10-04 HISTORY — DX: Unspecified glaucoma: H40.9

## 2018-10-04 NOTE — Therapy (Signed)
[facility] [address] [place], [place], [address] Phone: [phone]   Fax:  [phone]  Physical Therapy Evaluation  Patient Details  Name: Gabriella Porter MRN: [id number] Date of Birth: Jun 27, 1961 Referring Provider (PT): Dr Clare Gandy   Encounter Date: 10/04/2018  PT End of Session - 10/04/18 1104    Visit Number  1    Number of Visits  4    Date for PT Re-Evaluation  11/29/18    Authorization Time Period  every other week per pt request     PT Start Time  1106    PT Stop Time  1159    PT Time Calculation (min)  53 min       Past Medical History:  Diagnosis Date  . Arthritis   . Asthma   . Depression   . GERD (gastroesophageal reflux disease)   . Glaucoma 10/04/2018  . History of recurrent UTI (urinary tract infection)   . Hypertension     Past Surgical History:  Procedure Laterality Date  . ABDOMINAL HYSTERECTOMY  2003    There were no vitals filed for this visit.   Subjective Assessment - 10/04/18 1106    Subjective  Pt reports she has had low back and knee pain for atleast a year.  She does spend 3 hrs a day driving and has started stopping 1-2 times a trip to loosen up.     How long can you sit comfortably?  varies with her pain level and activity    How long can you walk comfortably?  no limitations - she forces herself to walk.      Patient Stated Goals  make sure she is doing correct exercise and help her back    Currently in Pain?  Yes    Pain Score  5     Pain Location  Back    Pain Orientation  Lower    Pain Descriptors / Indicators  Tightness    Pain Type  Chronic pain    Pain Onset  More than a month ago    Pain Frequency  Constant    Aggravating Factors   standing    Pain Relieving Factors  putting pressure through her back         [facility] PT Assessment - 10/04/18 0001      Assessment   Medical Diagnosis  LBP and bilat knee pain    Referring Provider (PT)  Dr Clare Gandy    Onset Date/Surgical Date  10/04/17    Hand Dominance  Right    Next MD Visit  6 wks    Prior Therapy  none      Precautions   Precautions  None      Balance Screen   Has the patient fallen in the past 6 months  No      Prior Function   Level of Independence  Independent    Vocation  Full time employment    Vocation Requirements  long drive, 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    Leisure  sedentary      Observation/Other Assessments   Focus on Therapeutic Outcomes (FOTO)   64% limited       Functional Tests   Functional tests  Squat      Squat   Comments  pulling in back      Posture/Postural Control   Posture/Postural Control  Postural limitations    Postural Limitations  Rounded Shoulders;Flexed trunk   obesity  ROM / Strength   AROM / PROM / Strength  AROM;Strength      AROM   AROM Assessment Site  Hip;Knee;Lumbar    Lumbar Flexion  to the floor    Lumbar Extension  10 degrees with pain    Lumbar - Right Side Bend  WNL    Lumbar - Left Side Bend  WNL with pain     Lumbar - Right Rotation  WNL with pain    Lumbar - Left Rotation  WNL      Strength   Strength Assessment Site  Hip;Knee;Ankle;Lumbar    Right/Left Hip  --   bilat grossly 4+/5, pain with resisted hip flex   Right/Left Knee  --   WNL   Right/Left Ankle  --   WNL   Lumbar Flexion  --   TA poor   Lumbar Extension  --   multifidi fair     Flexibility   Soft Tissue Assessment /Muscle Length  --   quads and hamstrings WNL     Palpation   Spinal mobility  hypomobile in lumbar spine, pain with CPA mobs at L3-T10    Palpation comment  tight bilat lumbar paraspinals and gluts, painful on Lt side      Special Tests   Other special tests  (-) slump ans SLR bilat                 Objective measurements completed on examination: See above findings.      [facility] Adult PT Treatment/Exercise - 10/04/18 0001      Exercises   Exercises  Lumbar      Lumbar Exercises: Stretches   Single  Knee to Chest Stretch  Left;Right;2 reps;20 seconds    Lower Trunk Rotation  2 reps;20 seconds      Lumbar Exercises: Supine   Pelvic Tilt  10 reps   rocking, VC for form     Lumbar Exercises: Prone   Other Prone Lumbar Exercises  10 reps press ups      Modalities   Modalities  Electrical Stimulation;Moist Heat      Moist Heat Therapy   Number Minutes Moist Heat  15 Minutes    Moist Heat Location  Lumbar Spine      Electrical Stimulation   Electrical Stimulation Location  lumbar    Electrical Stimulation Action  IFC    Electrical Stimulation Parameters  to toleracne    Electrical Stimulation Goals  Pain;Tone             PT Education - 10/04/18 1148    Education Details  HEP and home TENs unit    Person(s) Educated  Patient    Methods  Explanation;Demonstration;Handout    Comprehension  Returned demonstration;Verbalized understanding;Verbal cues required          PT Long Term Goals - 10/04/18 1118      PT LONG TERM GOAL #1   Title  I with advanced HEP ( 11/29/2018)     Time  8    Period  Weeks    Status  New    Target Date  11/29/18      PT LONG TERM GOAL #2   Title  report =/> 75% reduction of back pain at the end of the day (11/29/2018)     Time  8    Period  Weeks    Status  New    Target Date  11/29/18      PT LONG TERM GOAL #3  Title  improve FOTO =/< 49% limited ( 11/29/2018)     Time  8    Period  Weeks    Status  New    Target Date  11/29/18      PT LONG TERM GOAL #4   Title  improve bilat hip strength =/> 5-/5 to allow return to walking program ( 11/29/2018)     Time  8    Period  Weeks    Status  New    Target Date  11/29/18      PT LONG TERM GOAL #5   Title  demo painfree lumbar ROM ( 11/29/2018)     Time  8    Period  Weeks    Status  New    Target Date  11/29/18             Plan - 10/04/18 1148    Clinical Impression Statement  58 yo female with h/o low back and knee pain for over a year.  She is very hypomobile in her  lumbar spine with decreased pelvic mobility.  She has slight weakness in bilat hips and significant weakness in her core.  ROM is WNL however painful in her lumbar spine.  She works over and hour a way  and will be limited with her ability to attend sessions due to this, requesting everyother week visits. She would benefit from PT to restore lumbar and pelvic mobility, decrease muscular tightness and improve core strength so she can participate in an exercise and walking program.     Clinical Presentation  Stable    Clinical Decision Making  Low    Rehab Potential  Good    PT Frequency  Biweekly    PT Duration  8 weeks    PT Treatment/Interventions  Iontophoresis 4mg /ml Dexamethasone;Passive range of motion;Neuromuscular re-education;Moist Heat;Traction;Ultrasound;Manual techniques;Spinal Manipulations;Dry needling;Patient/family education;Therapeutic exercise;Electrical Stimulation;Cryotherapy    PT Next Visit Plan  spinal mobs lumbar, manaul work, initial core stability and modalities PRN    Consulted and Agree with Plan of Care  Patient       Patient will benefit from skilled therapeutic intervention in order to improve the following deficits and impairments:  Pain, Decreased strength, Difficulty walking, Increased muscle spasms  Visit Diagnosis: Chronic bilateral low back pain without sciatica - Plan: PT plan of care cert/re-cert  Muscle weakness (generalized) - Plan: PT plan of care cert/re-cert  Muscle spasm of back - Plan: PT plan of care cert/re-cert     Problem List Patient Active Problem List   Diagnosis Date Noted  . Left hand pain 07/22/2018  . Chronic pain of both knees 07/22/2018  . Essential hypertension 03/27/2018  . Extrinsic asthma 03/27/2018  . Depression 03/27/2018  . Chronic low back pain 03/27/2018    Roderic Scarce PT  10/04/2018, 11:55 AM  [facility] [address] [place], [place], [address] Phone:  [phone]   Fax:  [phone]  Name: Berenisse Robotham MRN: [id number] Date of Birth: 12-28-1960

## 2018-10-07 ENCOUNTER — Ambulatory Visit: Payer: Federal, State, Local not specified - PPO | Admitting: Neurology

## 2018-10-11 ENCOUNTER — Ambulatory Visit: Payer: Self-pay | Admitting: Nurse Practitioner

## 2018-10-18 ENCOUNTER — Ambulatory Visit: Payer: Federal, State, Local not specified - PPO | Attending: Family Medicine | Admitting: Physical Therapy

## 2018-10-18 ENCOUNTER — Encounter: Payer: Self-pay | Admitting: Physical Therapy

## 2018-10-18 ENCOUNTER — Other Ambulatory Visit: Payer: Self-pay

## 2018-10-18 ENCOUNTER — Other Ambulatory Visit: Payer: Self-pay | Admitting: Nurse Practitioner

## 2018-10-18 DIAGNOSIS — M6281 Muscle weakness (generalized): Secondary | ICD-10-CM | POA: Insufficient documentation

## 2018-10-18 DIAGNOSIS — M545 Low back pain, unspecified: Secondary | ICD-10-CM

## 2018-10-18 DIAGNOSIS — G8929 Other chronic pain: Secondary | ICD-10-CM | POA: Insufficient documentation

## 2018-10-18 DIAGNOSIS — M62830 Muscle spasm of back: Secondary | ICD-10-CM | POA: Diagnosis not present

## 2018-10-18 MED ORDER — OLMESARTAN MEDOXOMIL 20 MG PO TABS: 20.0000 mg | ORAL_TABLET | Freq: Every day | ORAL | 0 refills | Status: DC

## 2018-10-18 NOTE — Therapy (Addendum)
[place], [place], [address] Phone: [phone]   Fax:  [phone]  Physical Therapy Treatment/Discharge  Patient Details  Name: Gabriella Porter MRN: [id number] Date of Birth: March 08, 1961 Referring Provider (PT): Dr Clearance Coots   Encounter Date: 10/18/2018  PT End of Session - 10/18/18 1103    Visit Number  2    Number of Visits  4    Date for PT Re-Evaluation  11/29/18    Authorization Time Period  every other week per pt request     PT Start Time  1103    PT Stop Time  1157    PT Time Calculation (min)  54 min    Activity Tolerance  Patient tolerated treatment well       Past Medical History:  Diagnosis Date  . Arthritis   . Asthma   . Depression   . GERD (gastroesophageal reflux disease)   . Glaucoma 10/04/2018  . History of recurrent UTI (urinary tract infection)   . Hypertension     Past Surgical History:  Procedure Laterality Date  . ABDOMINAL HYSTERECTOMY  2003    There were no vitals filed for this visit.  Subjective Assessment - 10/18/18 1106    Subjective  Pt reports she felt a lot better after her last treatment, doing the exercise some however not every day.      Patient Stated Goals  make sure she is doing correct exercise and help her back    Currently in Pain?  Yes    Pain Score  3     Pain Location  Knee    Pain Orientation  Left;Right    Pain Descriptors / Indicators  Dull;Aching    Pain Type  Chronic pain    Pain Onset  More than a month ago    Pain Frequency  Constant    Aggravating Factors   it is what it is          [facility] PT Assessment - 10/18/18 0001      Assessment   Medical Diagnosis  LBP and bilat knee pain    Referring Provider (PT)  Dr Clearance Coots                   [facility] Adult PT Treatment/Exercise - 10/18/18 0001      Lumbar Exercises: Aerobic   Nustep  U/LE began at L5, decreased to 3 d/t knee pain, intervals of 1' regular speed  30 sec high speed       Lumbar Exercises: Seated   Sit to Stand  20 reps   goblet squats with 5#   Other Seated Lumbar Exercises  3x10 long sit SLR with ER each side      Lumbar Exercises: Supine   Bridge  20 reps   with blue band around knees    Bridge Limitations  had some cramping into Rt hamstring     Isometric Hip Flexion  10 reps;5 seconds   alternating sides, same hand and opposite     Lumbar Exercises: Prone   Other Prone Lumbar Exercises  10 reps press ups      Modalities   Modalities  Electrical Stimulation;Moist Heat      Moist Heat Therapy   Number Minutes Moist Heat  15 Minutes    Moist Heat Location  Lumbar Spine      Electrical Stimulation   Electrical Stimulation Location  lumbar    Electrical Stimulation Action  IFC  Electrical Stimulation Parameters  to tolerance     Electrical Stimulation Goals  Pain;Tone                  PT Long Term Goals - 10/18/18 1239      PT LONG TERM GOAL #1   Title  I with advanced HEP ( 11/29/2018)     Status  On-going      PT LONG TERM GOAL #2   Title  report =/> 75% reduction of back pain at the end of the day (11/29/2018)     Status  On-going      PT LONG TERM GOAL #3   Title  improve FOTO =/< 49% limited ( 11/29/2018)     Status  On-going      PT LONG TERM GOAL #4   Title  improve bilat hip strength =/> 5-/5 to allow return to walking program ( 11/29/2018)     Status  On-going      PT LONG TERM GOAL #5   Title  demo painfree lumbar ROM ( 11/29/2018)     Status  On-going            Plan - 10/18/18 1239    Clinical Impression Statement  This is [name] second visit, she reports improvement in her low back pain, continues to have knee pain more consistently.  She was ready for exercise progression .  Required VC for safe and proper form and demo'd weakness with the exercise.  Making good progress as expected to her goals.     Rehab Potential  Good    PT Frequency  Biweekly    PT Duration  8 weeks     PT Treatment/Interventions  Iontophoresis '4mg'$ /ml Dexamethasone;Passive range of motion;Neuromuscular re-education;Moist Heat;Traction;Ultrasound;Manual techniques;Spinal Manipulations;Dry needling;Patient/family education;Therapeutic exercise;Electrical Stimulation;Cryotherapy    PT Next Visit Plan  if doing well add in dead lifts.     Consulted and Agree with Plan of Care  Patient       Patient will benefit from skilled therapeutic intervention in order to improve the following deficits and impairments:  Pain, Decreased strength, Difficulty walking, Increased muscle spasms  Visit Diagnosis: Chronic bilateral low back pain without sciatica  Muscle weakness (generalized)  Muscle spasm of back     Problem List Patient Active Problem List   Diagnosis Date Noted  . Left hand pain 07/22/2018  . Chronic pain of both knees 07/22/2018  . Essential hypertension 03/27/2018  . Extrinsic asthma 03/27/2018  . Depression 03/27/2018  . Chronic low back pain 03/27/2018    Jeral Pinch PT  10/18/2018, 12:44 PM  [facility] [address] [place], [place], [address] Phone: [phone]   Fax:  [phone]  Name: Gabriella Porter MRN: [id number] Date of Birth: 1961-06-13   PHYSICAL THERAPY DISCHARGE SUMMARY  Visits from Start of Care: 2  Current functional level related to goals / functional outcomes: Pt was placed on hold due to closures with covid, MD notes of recent have her being referred to a neurosurgeon.    Remaining deficits: unknown   Education / Equipment: HEP Plan: Patient agrees to discharge.  Patient goals were not met. Patient is being discharged due to a change in medical status.  ?????    Jeral Pinch, PT 04/01/19 12:07 PM

## 2018-11-11 ENCOUNTER — Encounter: Payer: Self-pay | Admitting: Internal Medicine

## 2018-11-11 ENCOUNTER — Ambulatory Visit (INDEPENDENT_AMBULATORY_CARE_PROVIDER_SITE_OTHER): Payer: Federal, State, Local not specified - PPO | Admitting: Internal Medicine

## 2018-11-11 DIAGNOSIS — I10 Essential (primary) hypertension: Secondary | ICD-10-CM

## 2018-11-11 DIAGNOSIS — N390 Urinary tract infection, site not specified: Secondary | ICD-10-CM | POA: Insufficient documentation

## 2018-11-11 DIAGNOSIS — J4520 Mild intermittent asthma, uncomplicated: Secondary | ICD-10-CM

## 2018-11-11 DIAGNOSIS — N3000 Acute cystitis without hematuria: Secondary | ICD-10-CM | POA: Diagnosis not present

## 2018-11-11 MED ORDER — NITROFURANTOIN MONOHYD MACRO 100 MG PO CAPS: 100.0000 mg | ORAL_CAPSULE | Freq: Two times a day (BID) | ORAL | 0 refills | Status: DC

## 2018-11-11 MED ORDER — OLMESARTAN MEDOXOMIL 20 MG PO TABS: 20.0000 mg | ORAL_TABLET | Freq: Every day | ORAL | 0 refills | Status: DC

## 2018-11-11 MED ORDER — ALBUTEROL SULFATE HFA 108 (90 BASE) MCG/ACT IN AERS: 2.0000 | INHALATION_SPRAY | Freq: Four times a day (QID) | RESPIRATORY_TRACT | 0 refills | Status: DC | PRN

## 2018-11-11 NOTE — Assessment & Plan Note (Signed)
She is not routinely monitoring BP but when checked it is at goal. Refill olmesartan for 90 day rx and advised to make a visit within that time frame for physical with her provider.

## 2018-11-11 NOTE — Progress Notes (Signed)
Virtual Visit via Video Note  I connected with Gabriella Porter on 11/11/18 at  9:40 AM EDT by a video enabled telemedicine application and verified that I am speaking with the correct person using two identifiers.   I discussed the limitations of evaluation and management by telemedicine and the availability of in person appointments. The patient expressed understanding and agreed to proceed.  History of Present Illness: The patient is a 58 y.o.  female with visit for several concerns including possible uti (started about 1-2 weeks ago, has a lot of uti and this feels typical, has burning and urgency as well as night time urination, some mild low stomach and back discomfort, denies fevers or chills, denies vomiting, mild nausea, still eating and drinking well, overall it is worsening, has tried drinking fluids and cranberry juice) and blood pressure (taking olmesartan, BP at home normal, does not check often, denies headaches or chest pains, denies side effects from medication) and asthma (she has reaction with pollen, does not need controller medication, uses nose spray and keeps this with her, uses albuterol prn and has an inhaler but is worried that it could be low, needs new rx to pharmacy, with coronavirus she is concerned that she have an inhaler on hand).   Observations/Objective: Appearance: normal, breathing appears normal, casual grooming, abdomen does not appear distended, throat normal, mental status is A and O times 3  Assessment and Plan: See problem oriented charting  Follow Up Instructions: rx for albuterol inhaler, olmesartan 20 mg daily, and macrobid for uti  I discussed the assessment and treatment plan with the patient. The patient was provided an opportunity to ask questions and all were answered. The patient agreed with the plan and demonstrated an understanding of the instructions.   The patient was advised to call back or seek an in-person evaluation if the symptoms worsen or if  the condition fails to improve as anticipated.  Myrlene Broker, MD

## 2018-11-11 NOTE — Assessment & Plan Note (Signed)
No flare today but she needs refill on albuterol inhaler as pollen is one of her triggers and with the coronavirus outbreak she is concerned about running out of medication.

## 2018-11-11 NOTE — Assessment & Plan Note (Signed)
She does have recurrent UTI and typical symptoms. Will rx macrobid and if no resolution needs U/A and culture. Will forgo today as risk of leaving home outweighs the potential information gained from urine collection and testing given the coronavirus outbreak.

## 2018-12-05 ENCOUNTER — Other Ambulatory Visit: Payer: Self-pay | Admitting: Internal Medicine

## 2018-12-05 DIAGNOSIS — J4520 Mild intermittent asthma, uncomplicated: Secondary | ICD-10-CM

## 2018-12-05 NOTE — Telephone Encounter (Signed)
Call and find out if she is having breathing problems, if so needs visit. If not she should not need refill as it was sent less than a month ago.

## 2018-12-20 ENCOUNTER — Encounter: Payer: Self-pay | Admitting: Family

## 2018-12-20 ENCOUNTER — Ambulatory Visit (INDEPENDENT_AMBULATORY_CARE_PROVIDER_SITE_OTHER): Payer: Federal, State, Local not specified - PPO | Admitting: Family

## 2018-12-20 ENCOUNTER — Other Ambulatory Visit: Payer: Self-pay

## 2018-12-20 ENCOUNTER — Other Ambulatory Visit (INDEPENDENT_AMBULATORY_CARE_PROVIDER_SITE_OTHER): Payer: Federal, State, Local not specified - PPO

## 2018-12-20 VITALS — BP 126/82 | HR 97 | Temp 98.1°F | Ht 59.0 in | Wt 202.0 lb

## 2018-12-20 DIAGNOSIS — M79662 Pain in left lower leg: Secondary | ICD-10-CM

## 2018-12-20 DIAGNOSIS — M79661 Pain in right lower leg: Secondary | ICD-10-CM

## 2018-12-20 DIAGNOSIS — I776 Arteritis, unspecified: Secondary | ICD-10-CM

## 2018-12-20 LAB — COMPREHENSIVE METABOLIC PANEL
ALT: 41 U/L — ABNORMAL HIGH (ref 0–35)
AST: 20 U/L (ref 0–37)
Albumin: 3.9 g/dL (ref 3.5–5.2)
Alkaline Phosphatase: 115 U/L (ref 39–117)
BUN: 15 mg/dL (ref 6–23)
CO2: 26 mEq/L (ref 19–32)
Calcium: 9.2 mg/dL (ref 8.4–10.5)
Chloride: 105 mEq/L (ref 96–112)
Creatinine, Ser: 0.75 mg/dL (ref 0.40–1.20)
GFR: 96.1 mL/min (ref >60.00)
Glucose, Bld: 113 mg/dL — ABNORMAL HIGH (ref 70–99)
Potassium: 3.7 mEq/L (ref 3.5–5.1)
Sodium: 140 mEq/L (ref 135–145)
Total Bilirubin: 0.4 mg/dL (ref 0.2–1.2)
Total Protein: 7.4 g/dL (ref 6.0–8.3)

## 2018-12-20 LAB — CBC WITH DIFFERENTIAL/PLATELET
Basophils Absolute: 0.1 10*3/uL (ref 0.0–0.1)
Basophils Relative: 0.7 % (ref 0.0–3.0)
Eosinophils Absolute: 0.4 10*3/uL (ref 0.0–0.7)
Eosinophils Relative: 3.3 % (ref 0.0–5.0)
HCT: 38.5 % (ref 36.0–46.0)
Hemoglobin: 13.3 g/dL (ref 12.0–15.0)
Lymphocytes Relative: 33.8 % (ref 12.0–46.0)
Lymphs Abs: 4.3 10*3/uL — ABNORMAL HIGH (ref 0.7–4.0)
MCHC: 34.6 g/dL (ref 30.0–36.0)
MCV: 82.2 fl (ref 78.0–100.0)
Monocytes Absolute: 0.8 10*3/uL (ref 0.1–1.0)
Monocytes Relative: 6.7 % (ref 3.0–12.0)
Neutro Abs: 7 10*3/uL (ref 1.4–7.7)
Neutrophils Relative %: 55.5 % (ref 43.0–77.0)
Platelets: 304 10*3/uL (ref 150.0–400.0)
RBC: 4.69 Mil/uL (ref 3.87–5.11)
RDW: 13.8 % (ref 11.5–15.5)
WBC: 12.7 10*3/uL — ABNORMAL HIGH (ref 4.0–10.5)

## 2018-12-20 LAB — SEDIMENTATION RATE: Sed Rate: 23 mm/hr (ref 0–30)

## 2018-12-20 NOTE — Progress Notes (Signed)
Gabriella Porter is a 58 y.o. female with the following history as recorded in EpicCare:  Patient Active Problem List   Diagnosis Date Noted  . UTI (urinary tract infection) 11/11/2018  . Left hand pain 07/22/2018  . Chronic pain of both knees 07/22/2018  . Essential hypertension 03/27/2018  . Extrinsic asthma 03/27/2018  . Depression 03/27/2018  . Chronic low back pain 03/27/2018    Current Outpatient Medications  Medication Sig Dispense Refill  . albuterol (PROVENTIL HFA;VENTOLIN HFA) 108 (90 Base) MCG/ACT inhaler Inhale 2 puffs into the lungs every 6 (six) hours as needed for wheezing or shortness of breath. 1 Inhaler 0  . aspirin EC 81 MG tablet Take 1 tablet (81 mg total) by mouth daily.    Marland Kitchen buPROPion (WELLBUTRIN SR) 100 MG 12 hr tablet TAKE 1 TABLET BY MOUTH EVERY DAY 90 tablet 2  . Diclofenac Sodium (PENNSAID) 2 % SOLN Place 1 application onto the skin 2 (two) times daily. 1 Bottle 3  . Ibuprofen-Famotidine 800-26.6 MG TABS Take 1 tablet by mouth 3 (three) times daily. 90 tablet 3  . Multiple Vitamins-Minerals (ONE DAILY CALCIUM/IRON PO) Take 1 each by mouth daily.    Marland Kitchen olmesartan (BENICAR) 20 MG tablet Take 1 tablet (20 mg total) by mouth daily. Annual appt is due must see provider for future refills 90 tablet 0  . Vitamin D3 (VITAMIN D) 25 MCG tablet Take 1,000 Units by mouth daily.    . Travoprost, BAK Free, (TRAVATAN) 0.004 % SOLN ophthalmic solution      No current facility-administered medications for this visit.     Allergies: Sulfa antibiotics  Past Medical History:  Diagnosis Date  . Arthritis   . Asthma   . Depression   . GERD (gastroesophageal reflux disease)   . Glaucoma 10/04/2018  . History of recurrent UTI (urinary tract infection)   . Hypertension     Past Surgical History:  Procedure Laterality Date  . ABDOMINAL HYSTERECTOMY  2003    Family History  Problem Relation Age of Onset  . Diabetes Mother   . Heart disease Mother   . Hyperlipidemia Mother    . Hypertension Mother   . Stroke Mother   . Diabetes Father   . Arthritis Father   . Heart disease Father   . Kidney disease Father   . Cancer Sister   . Diabetes Maternal Grandmother   . Heart disease Maternal Grandmother   . Hypertension Maternal Grandmother   . Diabetes Maternal Grandfather   . Heart disease Maternal Grandfather   . Hypertension Maternal Grandfather   . Diabetes Paternal Grandmother   . Heart disease Paternal Grandmother   . Hypertension Paternal Grandmother   . Diabetes Paternal Grandfather   . Heart disease Paternal Grandfather   . Hypertension Paternal Grandfather   . Hyperlipidemia Sister   . Bipolar disorder Sister     Social History   Tobacco Use  . Smoking status: Never Smoker  . Smokeless tobacco: Never Used  Substance Use Topics  . 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       Objective:  Vitals:   12/20/18 1518  BP: 126/82  Pulse: 97  Temp:  98.1 F (36.7 C)  TempSrc: Oral  SpO2: 97%  Weight: 202 lb 0.2 oz (91.6 kg)  Height: _0  (1.499 m)    General: Well developed, well nourished, in no acute distress  Skin : Warm and dry. Bilateral skin discoloration/ darkening noted along both calves ( left greater than right) Head: Normocephalic and atraumatic  Lungs: Respirations unlabored; clear to auscultation bilaterally without wheeze, rales, rhonchi  CVS exam: normal rate and regular rhythm.  Extremities: No pitting edema, cyanosis, clubbing  Vessels: Symmetric bilaterally  Neurologic: Alert and oriented; speech intact; face symmetrical; moves all extremities well; CNII-XII intact without focal deficit    Assessment:  1. Bilateral calf pain   2. Vasculitis ([place])     Plan:  ? Varicose veins- update labs and venous dopplers; will most likely need to refer to vascular specialist. Follow-up to be determined;   No follow-ups on file.  Orders Placed This Encounter  Procedures  . CBC w/Diff    Standing Status:   Future    Number of Occurrences:   1    Standing Expiration Date:   12/20/2019  . Comp Met (CMET)    Standing Status:   Future    Number of Occurrences:   1    Standing Expiration Date:   12/20/2019  . D-Dimer, Quantitative    Standing Status:   Future    Number of Occurrences:   1    Standing Expiration Date:   12/20/2019  . Antinuclear Antib (ANA)    Standing Status:   Future    Number of Occurrences:   1    Standing Expiration Date:   12/20/2019  . Sed Rate (ESR)    Standing Status:   Future    Number of Occurrences:   1    Standing Expiration Date:   12/20/2019    Requested Prescriptions    No prescriptions requested or ordered in this encounter

## 2018-12-21 ENCOUNTER — Other Ambulatory Visit: Payer: Self-pay | Admitting: Neurology

## 2018-12-22 ENCOUNTER — Encounter: Payer: Self-pay | Admitting: Family

## 2018-12-23 ENCOUNTER — Other Ambulatory Visit: Payer: Self-pay

## 2018-12-23 ENCOUNTER — Telehealth: Payer: Self-pay

## 2018-12-23 ENCOUNTER — Encounter: Payer: Self-pay | Admitting: Family

## 2018-12-23 ENCOUNTER — Ambulatory Visit (HOSPITAL_COMMUNITY)
Admission: RE | Admit: 2018-12-23 | Discharge: 2018-12-23 | Disposition: A | Discharge status: Home / Self Care | Payer: Federal, State, Local not specified - PPO | Source: Ambulatory Visit | Attending: Cardiology | Admitting: Cardiology

## 2018-12-23 DIAGNOSIS — M79661 Pain in right lower leg: Secondary | ICD-10-CM | POA: Diagnosis not present

## 2018-12-23 DIAGNOSIS — M79662 Pain in left lower leg: Secondary | ICD-10-CM | POA: Diagnosis not present

## 2018-12-23 NOTE — Telephone Encounter (Signed)
(  FYI) Alicia called from [facility]. Patient is negative for blood clots so they let her go and sent her home.

## 2018-12-24 ENCOUNTER — Other Ambulatory Visit: Payer: Self-pay | Admitting: Family

## 2018-12-24 DIAGNOSIS — R05 Cough: Secondary | ICD-10-CM | POA: Diagnosis not present

## 2018-12-24 DIAGNOSIS — R062 Wheezing: Secondary | ICD-10-CM | POA: Diagnosis not present

## 2018-12-24 DIAGNOSIS — L819 Disorder of pigmentation, unspecified: Secondary | ICD-10-CM

## 2018-12-24 DIAGNOSIS — J45998 Other asthma: Secondary | ICD-10-CM | POA: Diagnosis not present

## 2018-12-24 LAB — ANTI-NUCLEAR AB-TITER (ANA TITER)
ANA TITER: 1:80 {titer} — ABNORMAL HIGH
ANA Titer 1: 1:80 {titer} — ABNORMAL HIGH

## 2018-12-24 LAB — D-DIMER, QUANTITATIVE: D-Dimer, Quant: 0.5 mcg/mL FEU — ABNORMAL HIGH (ref <0.50)

## 2018-12-24 LAB — ANA: Anti Nuclear Antibody (ANA): POSITIVE — AB

## 2018-12-24 MED ORDER — ALBUTEROL SULFATE (2.5 MG/3ML) 0.083% IN NEBU: 2.5000 mg | INHALATION_SOLUTION | Freq: Four times a day (QID) | RESPIRATORY_TRACT | 1 refills | Status: DC | PRN

## 2018-12-24 NOTE — Telephone Encounter (Signed)
Patient has been contacted; not having active asthma symptoms. Requesting nebulizer machine to have at home in case she needs it.  We will have her come pick up machine today; prescription for medication has been sent.

## 2018-12-24 NOTE — Telephone Encounter (Signed)
Please call and check on her. We did not get any news about an asthma attack. She has a prescription for an albuterol inhaler. How often is she using that? I don't know what she is talking about regarding "home asthma treatment."  I am glad her test did not show DVTs. I am going to have her see a vascular specialist to better evaluate her circulation.  We are not managing her Wellbutrin. I went back and looked at those records. Dr. Anne Hahn is the prescriber. If she wants to try taking twice a day like we discussed, she needs to let him know the response.

## 2018-12-26 ENCOUNTER — Other Ambulatory Visit: Payer: Self-pay | Admitting: Neurology

## 2018-12-26 ENCOUNTER — Encounter: Payer: Self-pay | Admitting: Family

## 2018-12-26 MED ORDER — BUPROPION HCL ER (SR) 100 MG PO TB12: 100.0000 mg | ORAL_TABLET | Freq: Two times a day (BID) | ORAL | 1 refills | Status: DC

## 2018-12-27 ENCOUNTER — Other Ambulatory Visit: Payer: Self-pay | Admitting: Family

## 2018-12-27 MED ORDER — AMOXICILLIN-POT CLAVULANATE 875-125 MG PO TABS: 1.0000 | ORAL_TABLET | Freq: Two times a day (BID) | ORAL | 0 refills | Status: DC

## 2019-01-13 ENCOUNTER — Encounter (HOSPITAL_COMMUNITY): Payer: Federal, State, Local not specified - PPO

## 2019-01-13 ENCOUNTER — Encounter: Payer: Federal, State, Local not specified - PPO | Admitting: Surgery

## 2019-01-28 ENCOUNTER — Other Ambulatory Visit: Payer: Self-pay

## 2019-01-28 DIAGNOSIS — L819 Disorder of pigmentation, unspecified: Secondary | ICD-10-CM

## 2019-01-30 ENCOUNTER — Telehealth (HOSPITAL_COMMUNITY): Payer: Self-pay | Admitting: Rehabilitation

## 2019-01-30 NOTE — Telephone Encounter (Signed)

## 2019-01-31 ENCOUNTER — Encounter: Payer: Self-pay | Admitting: Vascular Surgery

## 2019-01-31 ENCOUNTER — Ambulatory Visit (HOSPITAL_COMMUNITY)
Admission: RE | Admit: 2019-01-31 | Discharge: 2019-01-31 | Disposition: A | Discharge status: Home / Self Care | Payer: Federal, State, Local not specified - PPO | Source: Ambulatory Visit | Attending: Vascular Surgery | Admitting: Vascular Surgery

## 2019-01-31 ENCOUNTER — Other Ambulatory Visit: Payer: Self-pay

## 2019-01-31 ENCOUNTER — Ambulatory Visit (INDEPENDENT_AMBULATORY_CARE_PROVIDER_SITE_OTHER): Payer: Federal, State, Local not specified - PPO | Admitting: Vascular Surgery

## 2019-01-31 VITALS — BP 125/90 | HR 80 | Resp 18 | Ht 59.0 in | Wt 195.9 lb

## 2019-01-31 DIAGNOSIS — L819 Disorder of pigmentation, unspecified: Secondary | ICD-10-CM

## 2019-01-31 NOTE — Progress Notes (Signed)
Patient ID: Gabriella Porter, female   DOB: [birth year], 58 y.o.   MRN: [id number]  Reason for Consult: New Patient (Initial Visit)   Referred by Evaristo BuryShambley, Ashleigh N, NP  Subjective:     HPI:  Gabriella ParrCheryl Kantor is a 58 y.o. female presents for evaluation of discoloration bilateral calf muscles.  States this happened several months ago at least.  Does not cause any pain.  She is most concerned with the cosmetic appearance.  She has gained weight recently thinks this might be concerned.  No history of DVT.  No history of vascular surgery.  Risk factors include hypertension and family history of vascular and coronary artery disease.  Does not have any tissue loss or ulceration of her feet  Past Medical History:  Diagnosis Date  . Arthritis   . Asthma   . Depression   . GERD (gastroesophageal reflux disease)   . Glaucoma 10/04/2018  . History of recurrent UTI (urinary tract infection)   . Hypertension    Family History  Problem Relation Age of Onset  . Diabetes Mother   . Heart disease Mother   . Hyperlipidemia Mother   . Hypertension Mother   . Stroke Mother   . Diabetes Father   . Arthritis Father   . Heart disease Father   . Kidney disease Father   . Cancer Sister   . Diabetes Maternal Grandmother   . Heart disease Maternal Grandmother   . Hypertension Maternal Grandmother   . Diabetes Maternal Grandfather   . Heart disease Maternal Grandfather   . Hypertension Maternal Grandfather   . Diabetes Paternal Grandmother   . Heart disease Paternal Grandmother   . Hypertension Paternal Grandmother   . Diabetes Paternal Grandfather   . Heart disease Paternal Grandfather   . Hypertension Paternal Grandfather   . Hyperlipidemia Sister   . Bipolar disorder Sister    Past Surgical History:  Procedure Laterality Date  . ABDOMINAL HYSTERECTOMY  2003    Short Social History:  Social History   Tobacco Use  . Smoking status: Never Smoker  . Smokeless tobacco: Never Used   Substance Use Topics  . Alcohol use: Yes    Allergies  Allergen Reactions  . Sulfa Antibiotics     Current Outpatient Medications  Medication Sig Dispense Refill  . albuterol (PROVENTIL HFA;VENTOLIN HFA) 108 (90 Base) MCG/ACT inhaler Inhale 2 puffs into the lungs every 6 (six) hours as needed for wheezing or shortness of breath. 1 Inhaler 0  . albuterol (PROVENTIL) (2.5 MG/3ML) 0.083% nebulizer solution Take 3 mLs (2.5 mg total) by nebulization every 6 (six) hours as needed for wheezing or shortness of breath. 150 mL 1  . aspirin EC 81 MG tablet Take 1 tablet (81 mg total) by mouth daily.    Marland Kitchen. buPROPion (WELLBUTRIN SR) 100 MG 12 hr tablet Take 1 tablet (100 mg total) by mouth 2 (two) times daily. 180 tablet 1  . Ibuprofen-Famotidine 800-26.6 MG TABS Take 1 tablet by mouth 3 (three) times daily. 90 tablet 3  . lisinopril-hydrochlorothiazide (ZESTORETIC) 20-12.5 MG tablet Take by mouth.    . Multiple Vitamins-Minerals (ONE DAILY CALCIUM/IRON PO) Take 1 each by mouth daily.    Marland Kitchen. olmesartan (BENICAR) 20 MG tablet Take 1 tablet (20 mg total) by mouth daily. Annual appt is due must see provider for future refills 90 tablet 0  . Vitamin D3 (VITAMIN D) 25 MCG tablet Take 1,000 Units by mouth daily.     No current facility-administered medications for  this visit.     Review of Systems  Constitutional:  Constitutional negative. HENT: HENT negative.  Eyes: Eyes negative.  Respiratory: Respiratory negative.  Cardiovascular: Cardiovascular negative.  GI: Gastrointestinal negative.  Musculoskeletal: Musculoskeletal negative.  Skin: Positive for rash.  Neurological: Neurological negative. Hematologic: Hematologic/lymphatic negative.  Psychiatric: Psychiatric negative.        Objective:  Objective   Vitals:   01/31/19 1417  BP: 125/90  Pulse: 80  Resp: 18  SpO2: 96%  Weight: 195 lb 14.4 oz (88.9 kg)  Height: 4\' 11"  (1.499 m)   Body mass index is 39.57 kg/m.  Physical Exam  HENT:     Head: Normocephalic.     Nose: Nose normal.  Eyes:     Pupils: Pupils are equal, round, and reactive to light.  Neck:     Musculoskeletal: Neck supple.     Vascular: No carotid bruit.  Cardiovascular:     Rate and Rhythm: Normal rate and regular rhythm.     Pulses:          Carotid pulses are 2+ on the right side and 2+ on the left side.      Radial pulses are 2+ on the right side and 2+ on the left side.       Popliteal pulses are 2+ on the right side and 2+ on the left side.       Dorsalis pedis pulses are 2+ on the right side and 2+ on the left side.       Posterior tibial pulses are 2+ on the right side and 2+ on the left side.  Pulmonary:     Effort: Pulmonary effort is normal.     Breath sounds: Normal breath sounds.  Abdominal:     Palpations: Abdomen is soft.  Musculoskeletal:        General: No swelling.  Skin:    General: Skin is warm and dry.     Capillary Refill: Capillary refill takes less than 2 seconds.     Comments: Mottled appearance to bilateral calf muscles  Neurological:     General: No focal deficit present.     Mental Status: She is alert.  Psychiatric:        Mood and Affect: Mood normal.        Behavior: Behavior normal.        Thought Content: Thought content normal.        Judgment: Judgment normal.     Data: I have independently interpreted her bilateral ABIs to be greater than 1 with toe pressure on the right 125 and left 121     Assessment/Plan:     54 female with discoloration of the skin overlying the bilateral calf muscles.  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  I discussed this with her and her blood flow appears to be normal she does not appear to have any significant varicose veins in her legs either.  She can follow-up on a as needed basis.     Waynetta Sandy MD [facility], [place]

## 2019-02-03 ENCOUNTER — Other Ambulatory Visit: Payer: Self-pay | Admitting: Internal Medicine

## 2019-02-10 ENCOUNTER — Other Ambulatory Visit: Payer: Self-pay

## 2019-02-10 ENCOUNTER — Ambulatory Visit (INDEPENDENT_AMBULATORY_CARE_PROVIDER_SITE_OTHER): Payer: Federal, State, Local not specified - PPO | Admitting: Family

## 2019-02-10 ENCOUNTER — Encounter: Payer: Self-pay | Admitting: Family

## 2019-02-10 VITALS — BP 130/86 | HR 88 | Temp 98.1°F | Ht 59.0 in

## 2019-02-10 DIAGNOSIS — M5442 Lumbago with sciatica, left side: Secondary | ICD-10-CM

## 2019-02-10 DIAGNOSIS — G8929 Other chronic pain: Secondary | ICD-10-CM | POA: Diagnosis not present

## 2019-02-10 DIAGNOSIS — M5441 Lumbago with sciatica, right side: Secondary | ICD-10-CM

## 2019-02-10 MED ORDER — METHYLPREDNISOLONE 4 MG PO TBPK: ORAL_TABLET | ORAL | 0 refills | Status: DC

## 2019-02-10 MED ORDER — METHOCARBAMOL 500 MG PO TABS: 500.0000 mg | ORAL_TABLET | Freq: Three times a day (TID) | ORAL | 0 refills | Status: DC | PRN

## 2019-02-10 NOTE — Patient Instructions (Signed)

## 2019-02-10 NOTE — Progress Notes (Signed)
Gabriella Porter is a 58 y.o. female with the following history as recorded in Epic[employer]:  Patient Active Problem List   Diagnosis Date Noted  . UTI (urinary tract infection) 11/11/2018  . Left hand pain 07/22/2018  . Chronic pain of both knees 07/22/2018  . Essential hypertension 03/27/2018  . Extrinsic asthma 03/27/2018  . Depression 03/27/2018  . Chronic low back pain 03/27/2018    Current Outpatient Medications  Medication Sig Dispense Refill  . albuterol (PROVENTIL HFA;VENTOLIN HFA) 108 (90 Base) MCG/ACT inhaler Inhale 2 puffs into the lungs every 6 (six) hours as needed for wheezing or shortness of breath. 1 Inhaler 0  . albuterol (PROVENTIL) (2.5 MG/3ML) 0.083% nebulizer solution Take 3 mLs (2.5 mg total) by nebulization every 6 (six) hours as needed for wheezing or shortness of breath. 150 mL 1  . aspirin EC 81 MG tablet Take 1 tablet (81 mg total) by mouth daily.    Marland Kitchen. buPROPion (WELLBUTRIN SR) 100 MG 12 hr tablet Take 1 tablet (100 mg total) by mouth 2 (two) times daily. 180 tablet 1  . Ibuprofen-Famotidine 800-26.6 MG TABS Take 1 tablet by mouth 3 (three) times daily. 90 tablet 3  . lisinopril-hydrochlorothiazide (ZESTORETIC) 20-12.5 MG tablet Take by mouth.    . Multiple Vitamins-Minerals (ONE DAILY CALCIUM/IRON PO) Take 1 each by mouth daily.    Marland Kitchen. olmesartan (BENICAR) 20 MG tablet Take 1 tablet (20 mg total) by mouth daily. **NEED TO ESTABLISH WITH NEW PROVIDER** NO FURTHER REFILLS WILL BE GIVEN** 90 tablet 0  . Vitamin D3 (VITAMIN D) 25 MCG tablet Take 1,000 Units by mouth daily.    . methocarbamol (ROBAXIN) 500 MG tablet Take 1 tablet (500 mg total) by mouth every 8 (eight) hours as needed. 30 tablet 0  . methylPREDNISolone (MEDROL DOSEPAK) 4 MG TBPK tablet Taper as directed 21 tablet 0   No current facility-administered medications for this visit.     Allergies: Sulfa antibiotics  Past Medical History:  Diagnosis Date  . Arthritis   . Asthma   . Depression   . GERD  (gastroesophageal reflux disease)   . Glaucoma 10/04/2018  . History of recurrent UTI (urinary tract infection)   . Hypertension     Past Surgical History:  Procedure Laterality Date  . ABDOMINAL HYSTERECTOMY  2003    Family History  Problem Relation Age of Onset  . Diabetes Mother   . Heart disease Mother   . Hyperlipidemia Mother   . Hypertension Mother   . Stroke Mother   . Diabetes Father   . Arthritis Father   . Heart disease Father   . Kidney disease Father   . Cancer Sister   . Diabetes Maternal Grandmother   . Heart disease Maternal Grandmother   . Hypertension Maternal Grandmother   . Diabetes Maternal Grandfather   . Heart disease Maternal Grandfather   . Hypertension Maternal Grandfather   . Diabetes Paternal Grandmother   . Heart disease Paternal Grandmother   . Hypertension Paternal Grandmother   . Diabetes Paternal Grandfather   . Heart disease Paternal Grandfather   . Hypertension Paternal Grandfather   . Hyperlipidemia Sister   . Bipolar disorder Sister     Social History   Tobacco Use  . Smoking status: Never Smoker  . Smokeless tobacco: Never Used  Substance Use Topics  . Alcohol use: Yes    Subjective:  Patient presents with concerns for low back pain x 2 weeks; pain is radiating down into her left leg;  is experiencing numbness/ tingling; known history of back problems- X-ray was done in December 2019 for similar symptoms and was referred to sports medicine; has done some PT with limited relief; denies any known injury to cause this particular flare- has been working in her yard more recently; notes that this is the most pain she has ever experienced with one of her back pain episodes- actually seen in a wheelchair today due to pain with walking; no changes in bowel or bladder control;    Objective:  Vitals:   02/10/19 1510  BP: 130/86  Pulse: 88  Temp: 98.1 F (36.7 C)  TempSrc: Oral  SpO2: 96%  Height: 4\' 11"  (1.499 m)    General: Well  developed, well nourished, in no acute distress  Skin : Warm and dry.  Head: Normocephalic and atraumatic  Lungs: Respirations unlabored; clear to auscultation bilaterally without wheeze, rales, rhonchi  CVS exam: normal rate and regular rhythm.  Musculoskeletal: No deformities; no active joint inflammation  Extremities: No edema, cyanosis, clubbing  Vessels: Symmetric bilaterally  Neurologic: Alert and oriented; speech intact; face symmetrical; moves all extremities well; CNII-XII intact without focal deficit   Assessment:  1. Acute bilateral low back pain with left-sided sciatica   2. Chronic bilateral low back pain with right-sided sciatica     Plan:  Reviewed lumbar x-ray from 07/2018; Will update MRI as symptoms have been present on and off for the past 6 months; Rx for Medrol Dose pak- take as directed; Rx for Robaxin 500 mg tid prn; will most likely need to refer to back specialist; follow-up to be determined.   No follow-ups on file.  Orders Placed This Encounter  Procedures  . MR Lumbar Spine Wo Contrast    Standing Status:   Future    Standing Expiration Date:   04/12/2020    Order Specific Question:   What is the patient's sedation requirement?    Answer:   No Sedation    Order Specific Question:   Does the patient have a pacemaker or implanted devices?    Answer:   No    Order Specific Question:   Preferred imaging location?    Answer:   [facility] (table limit-550lbs)    Order Specific Question:   Radiology Contrast Protocol - do NOT remove file path    Answer:   [email or web address]    Requested Prescriptions   Signed Prescriptions Disp Refills  . methylPREDNISolone (MEDROL DOSEPAK) 4 MG TBPK tablet 21 tablet 0    Sig: Taper as directed  . methocarbamol (ROBAXIN) 500 MG tablet 30 tablet 0    Sig: Take 1 tablet (500 mg total) by mouth every 8 (eight) hours as needed.

## 2019-02-12 ENCOUNTER — Encounter: Payer: Self-pay | Admitting: Family

## 2019-02-12 ENCOUNTER — Other Ambulatory Visit: Payer: Self-pay | Admitting: Family

## 2019-02-13 ENCOUNTER — Other Ambulatory Visit: Payer: Self-pay

## 2019-02-13 ENCOUNTER — Ambulatory Visit
Admission: RE | Admit: 2019-02-13 | Discharge: 2019-02-13 | Disposition: A | Discharge status: Home / Self Care | Payer: Federal, State, Local not specified - PPO | Source: Ambulatory Visit | Attending: Family | Admitting: Family

## 2019-02-13 DIAGNOSIS — M48061 Spinal stenosis, lumbar region without neurogenic claudication: Secondary | ICD-10-CM | POA: Diagnosis not present

## 2019-02-13 DIAGNOSIS — M5442 Lumbago with sciatica, left side: Secondary | ICD-10-CM

## 2019-02-17 ENCOUNTER — Encounter: Payer: Self-pay | Admitting: Family

## 2019-02-17 ENCOUNTER — Telehealth: Payer: Self-pay

## 2019-02-17 ENCOUNTER — Other Ambulatory Visit: Payer: Self-pay | Admitting: Family

## 2019-02-17 DIAGNOSIS — G8929 Other chronic pain: Secondary | ICD-10-CM

## 2019-02-17 NOTE — Telephone Encounter (Signed)
Spoke with patient and results given to her. She understands that she has been referred out and referral was just put in today. They will call her to schedule.

## 2019-02-21 ENCOUNTER — Encounter: Payer: Self-pay | Admitting: Family

## 2019-02-21 ENCOUNTER — Telehealth: Payer: Self-pay | Admitting: Family

## 2019-02-21 DIAGNOSIS — Z1231 Encounter for screening mammogram for malignant neoplasm of breast: Secondary | ICD-10-CM | POA: Diagnosis not present

## 2019-02-21 NOTE — Telephone Encounter (Signed)
Copied from [place] [id number]. Topic: Appointment Scheduling - Prior Auth Required for Appointment >> Feb 21, 2019 12:55 PM Richardo Priest, NT wrote: No appointment has been scheduled. Patient is requesting [facility] appointment with Jodi Mourning. Per scheduling protocol, this appointment requires a prior authorization prior to scheduling.  Route to department's PEC pool.

## 2019-02-21 NOTE — Telephone Encounter (Signed)
Left message informing. I have changed the PCP to Mickel Baas.

## 2019-02-21 NOTE — Telephone Encounter (Signed)
Patient is requesting to transfer care to you. She was previously established with Gabriella Porter. Would you be willing to see her to establish?  Please advise.

## 2019-02-21 NOTE — Telephone Encounter (Signed)
I have seen her enough recently to feel comfortable just switching her without another OV. Does she need something else right now? I know her back is causing her issues.

## 2019-02-27 ENCOUNTER — Encounter: Payer: Self-pay | Admitting: Family

## 2019-03-03 DIAGNOSIS — M4316 Spondylolisthesis, lumbar region: Secondary | ICD-10-CM | POA: Diagnosis not present

## 2019-04-10 ENCOUNTER — Encounter: Payer: Self-pay | Admitting: Family

## 2019-04-15 MED ORDER — CEFUROXIME AXETIL 500 MG PO TABS: 500.0000 mg | ORAL_TABLET | Freq: Two times a day (BID) | ORAL | 0 refills | Status: DC

## 2019-04-15 NOTE — Telephone Encounter (Signed)
I will send in course of antibiotics but she will have to be seen if the symptoms persist.

## 2019-05-04 ENCOUNTER — Other Ambulatory Visit: Payer: Self-pay | Admitting: Internal Medicine

## 2019-05-06 ENCOUNTER — Other Ambulatory Visit: Payer: Self-pay | Admitting: Family

## 2019-05-06 ENCOUNTER — Encounter: Payer: Self-pay | Admitting: Family

## 2019-05-06 MED ORDER — OLMESARTAN MEDOXOMIL 20 MG PO TABS: 20.0000 mg | ORAL_TABLET | Freq: Every day | ORAL | 1 refills | Status: DC

## 2019-05-26 DIAGNOSIS — H43811 Vitreous degeneration, right eye: Secondary | ICD-10-CM | POA: Diagnosis not present

## 2019-06-16 DIAGNOSIS — H5359 Other color vision deficiencies: Secondary | ICD-10-CM | POA: Diagnosis not present

## 2019-06-16 DIAGNOSIS — H401222 Low-tension glaucoma, left eye, moderate stage: Secondary | ICD-10-CM | POA: Diagnosis not present

## 2019-06-16 DIAGNOSIS — H53453 Other localized visual field defect, bilateral: Secondary | ICD-10-CM | POA: Diagnosis not present

## 2019-06-16 DIAGNOSIS — H401212 Low-tension glaucoma, right eye, moderate stage: Secondary | ICD-10-CM | POA: Diagnosis not present

## 2019-06-16 DIAGNOSIS — H43811 Vitreous degeneration, right eye: Secondary | ICD-10-CM | POA: Diagnosis not present

## 2019-08-06 ENCOUNTER — Other Ambulatory Visit: Payer: Self-pay | Admitting: Neurology

## 2019-08-06 ENCOUNTER — Encounter: Payer: Self-pay | Admitting: Neurology

## 2019-09-02 ENCOUNTER — Other Ambulatory Visit: Payer: Self-pay | Admitting: Neurology

## 2019-10-20 ENCOUNTER — Other Ambulatory Visit: Payer: Self-pay | Admitting: Internal Medicine

## 2019-10-20 DIAGNOSIS — J4520 Mild intermittent asthma, uncomplicated: Secondary | ICD-10-CM

## 2019-11-02 ENCOUNTER — Other Ambulatory Visit: Payer: Self-pay | Admitting: Family

## 2019-11-09 ENCOUNTER — Other Ambulatory Visit: Payer: Self-pay | Admitting: Family

## 2019-11-09 DIAGNOSIS — J4520 Mild intermittent asthma, uncomplicated: Secondary | ICD-10-CM

## 2019-11-10 ENCOUNTER — Encounter: Payer: Self-pay | Admitting: Internal Medicine

## 2019-11-10 ENCOUNTER — Other Ambulatory Visit: Payer: Self-pay

## 2019-11-10 ENCOUNTER — Ambulatory Visit: Payer: Federal, State, Local not specified - PPO | Admitting: Internal Medicine

## 2019-11-10 VITALS — BP 140/82 | HR 87 | Temp 98.0°F | Ht 59.0 in | Wt 189.4 lb

## 2019-11-10 DIAGNOSIS — R8290 Unspecified abnormal findings in urine: Secondary | ICD-10-CM | POA: Diagnosis not present

## 2019-11-10 DIAGNOSIS — N898 Other specified noninflammatory disorders of vagina: Secondary | ICD-10-CM

## 2019-11-10 DIAGNOSIS — R300 Dysuria: Secondary | ICD-10-CM | POA: Insufficient documentation

## 2019-11-10 DIAGNOSIS — M545 Low back pain, unspecified: Secondary | ICD-10-CM | POA: Insufficient documentation

## 2019-11-10 NOTE — Patient Instructions (Signed)
  Go downstairs and give a urine sample.     We will call you with the results.

## 2019-11-10 NOTE — Progress Notes (Signed)
Subjective:    Patient ID: Gabriella Porter, female    DOB: [birth year], 59 y.o.   MRN: [id number]  HPI The patient is here for an acute visit.   Her symptoms started 2-3 weeks ago.  She is having pain in her lower back, which is very typical when she gets a UTI.  She is sexually active and her last intercourse was 2 or 3 weeks ago when the symptoms started.  She has been sexually active with the same individual for the past 3 years and believes him to be monogamous, but does not know for sure.  She did notice a little vaginal discharge which was gray-brown once or twice only when her symptoms first started.  There was a little bit of an odor.  She would like to be checked for STDs in addition to a UTI.    Drinking water and cranberry, which seems to help.  She does have a history of UTIs.     Medications and allergies reviewed with patient and updated if appropriate.  Patient Active Problem List   Diagnosis Date Noted  . UTI (urinary tract infection) 11/11/2018  . Left hand pain 07/22/2018  . Chronic pain of both knees 07/22/2018  . Essential hypertension 03/27/2018  . Extrinsic asthma 03/27/2018  . Depression 03/27/2018  . Chronic low back pain 03/27/2018    Current Outpatient Medications on File Prior to Visit  Medication Sig Dispense Refill  . albuterol (PROVENTIL) (2.5 MG/3ML) 0.083% nebulizer solution Take 3 mLs (2.5 mg total) by nebulization every 6 (six) hours as needed for wheezing or shortness of breath. 150 mL 1  . albuterol (VENTOLIN HFA) 108 (90 Base) MCG/ACT inhaler INHALE 2 PUFFS INTO THE LUNGS EVERY 6 HOURS AS NEEDED FOR WHEEZE OR SHORTNESS OF BREATH 6.7 g 0  . aspirin EC 81 MG tablet Take 1 tablet (81 mg total) by mouth daily.    . Multiple Vitamins-Minerals (ONE DAILY CALCIUM/IRON PO) Take 1 each by mouth daily.    Marland Kitchen olmesartan (BENICAR) 20 MG tablet TAKE 1 TABLET BY MOUTH EVERY DAY 90 tablet 1  . Vitamin D3 (VITAMIN D) 25 MCG tablet Take 1,000 Units by mouth  daily.    Marland Kitchen buPROPion (WELLBUTRIN SR) 100 MG 12 hr tablet TAKE 1 TABLET BY MOUTH TWICE A DAY (Patient not taking: Reported on 11/10/2019) 60 tablet 0  . Ibuprofen-Famotidine 800-26.6 MG TABS Take 1 tablet by mouth 3 (three) times daily. (Patient not taking: Reported on 11/10/2019) 90 tablet 3  . latanoprost (XALATAN) 0.005 % ophthalmic solution 1 drop at bedtime.    . methocarbamol (ROBAXIN) 500 MG tablet Take 1 tablet (500 mg total) by mouth every 8 (eight) hours as needed. (Patient not taking: Reported on 11/10/2019) 30 tablet 0   No current facility-administered medications on file prior to visit.    Past Medical History:  Diagnosis Date  . Arthritis   . Asthma   . Depression   . GERD (gastroesophageal reflux disease)   . Glaucoma 10/04/2018  . History of recurrent UTI (urinary tract infection)   . Hypertension     Past Surgical History:  Procedure Laterality Date  . ABDOMINAL HYSTERECTOMY  2003    Social History   Socioeconomic History  . Marital status: Single    Spouse name: Not on file  . Number of children: Not on file  . Years of education: Not on file  . Highest education level: Not on file  Occupational History  . Not on  file  Tobacco Use  . Smoking status: Never Smoker  . Smokeless tobacco: Never Used  Substance and Sexual Activity  . Alcohol use: Yes  . Drug use: Never  . Sexual activity: Not on file  Other Topics Concern  . Not on file  Social History Narrative   Works as Company secretary   Social Determinants of Corporate investment banker Strain:   . Difficulty of Paying Living Expenses:   Food Insecurity:   . Worried About Programme researcher, broadcasting/film/video in the Last Year:   . Barista in the Last Year:   Transportation Needs:   . Freight forwarder (Medical):   Marland Kitchen Lack of Transportation (Non-Medical):   Physical Activity:   . Days of Exercise per Week:   . Minutes of Exercise per Session:   Stress:   . Feeling of Stress :   Social  Connections:   . Frequency of Communication with Friends and Family:   . Frequency of Social Gatherings with Friends and Family:   . Attends Religious Services:   . Active Member of Clubs or Organizations:   . Attends Banker Meetings:   Marland Kitchen Marital Status:     Family History  Problem Relation Age of Onset  . Diabetes Mother   . Heart disease Mother   . Hyperlipidemia Mother   . Hypertension Mother   . Stroke Mother   . Diabetes Father   . Arthritis Father   . Heart disease Father   . Kidney disease Father   . Cancer Sister   . Diabetes Maternal Grandmother   . Heart disease Maternal Grandmother   . Hypertension Maternal Grandmother   . Diabetes Maternal Grandfather   . Heart disease Maternal Grandfather   . Hypertension Maternal Grandfather   . Diabetes Paternal Grandmother   . Heart disease Paternal Grandmother   . Hypertension Paternal Grandmother   . Diabetes Paternal Grandfather   . Heart disease Paternal Grandfather   . Hypertension Paternal Grandfather   . Hyperlipidemia Sister   . Bipolar disorder Sister     Review of Systems  Constitutional: Negative for fever.  Gastrointestinal: Negative for abdominal pain and nausea.  Genitourinary: Positive for vaginal discharge. Negative for dysuria, frequency, hematuria and pelvic pain.       Urine cloudy  Musculoskeletal: Positive for back pain (lower back).       Objective:   Vitals:   11/10/19 1509  BP: 140/82  Pulse: 87  Temp: 98 F (36.7 C)  SpO2: 99%   BP Readings from Last 3 Encounters:  11/10/19 140/82  02/10/19 130/86  01/31/19 125/90   Wt Readings from Last 3 Encounters:  11/10/19 189 lb 6.4 oz (85.9 kg)  01/31/19 195 lb 14.4 oz (88.9 kg)  12/20/18 202 lb 0.2 oz (91.6 kg)   Body mass index is 38.25 kg/m.   Physical Exam Constitutional:      General: She is not in acute distress.    Appearance: Normal appearance. She is not ill-appearing.  Abdominal:     General: There is no  distension.     Palpations: Abdomen is soft.     Tenderness: There is no abdominal tenderness. There is no right CVA tenderness, left CVA tenderness, guarding or rebound.     Comments: Obese  Musculoskeletal:        General: Tenderness (Lower back, not increased with palpation) present.  Skin:    General: Skin is warm and dry.  Neurological:  Mental Status: She is alert.            Assessment & Plan:   Urine cloudy, vaginal discharge, lower back pain: The symptoms all started 2 to 3 weeks ago after intercourse She has a history of UTIs that often involve lower back pain and she is concerned her symptoms may be consistent with fat She also wants to be checked for STDs given the very minimal vaginal discharge that she had initially when her symptoms first started She is unsure what is going on with her body, but just knows something is off Urinalysis, urine culture Chlamydia and gonorrhea testing via urine She deferred blood work to rule out other STDs    This visit occurred during the SARS-CoV-2 public health emergency.  Safety protocols were in place, including screening questions prior to the visit, additional usage of staff PPE, and extensive cleaning of exam room while observing appropriate contact time as indicated for disinfecting solutions.

## 2019-11-11 ENCOUNTER — Encounter: Payer: Self-pay | Admitting: Internal Medicine

## 2019-11-11 LAB — URINE CULTURE: Result:: NO GROWTH

## 2019-11-12 ENCOUNTER — Encounter: Payer: Self-pay | Admitting: Internal Medicine

## 2019-11-12 LAB — URINALYSIS, ROUTINE W REFLEX MICROSCOPIC
Bilirubin Urine: NEGATIVE
Leukocytes,Ua: NEGATIVE
Nitrite: NEGATIVE
Specific Gravity, Urine: >=1.03 — AB (ref 1.000–1.030)
Total Protein, Urine: NEGATIVE
Urine Glucose: NEGATIVE
Urobilinogen, UA: 0.2 (ref 0.0–1.0)
WBC, UA: NONE SEEN (ref 0–?)
pH: 5 (ref 5.0–8.0)

## 2019-11-12 LAB — GC/CHLAMYDIA PROBE AMP
Chlamydia trachomatis, NAA: NEGATIVE
Neisseria Gonorrhoeae by PCR: NEGATIVE

## 2020-02-10 ENCOUNTER — Encounter: Payer: Self-pay | Admitting: Internal Medicine

## 2020-02-12 ENCOUNTER — Other Ambulatory Visit: Payer: Self-pay

## 2020-02-12 ENCOUNTER — Ambulatory Visit (INDEPENDENT_AMBULATORY_CARE_PROVIDER_SITE_OTHER): Payer: Federal, State, Local not specified - PPO | Admitting: Internal Medicine

## 2020-02-12 ENCOUNTER — Encounter: Payer: Self-pay | Admitting: Internal Medicine

## 2020-02-12 VITALS — BP 118/82 | HR 87 | Temp 98.1°F | Wt 195.0 lb

## 2020-02-12 DIAGNOSIS — F329 Major depressive disorder, single episode, unspecified: Secondary | ICD-10-CM

## 2020-02-12 DIAGNOSIS — L659 Nonscarring hair loss, unspecified: Secondary | ICD-10-CM

## 2020-02-12 DIAGNOSIS — R300 Dysuria: Secondary | ICD-10-CM

## 2020-02-12 DIAGNOSIS — F32A Depression, unspecified: Secondary | ICD-10-CM

## 2020-02-12 LAB — POC URINALSYSI DIPSTICK (AUTOMATED)
Bilirubin, UA: NEGATIVE
Glucose, UA: NEGATIVE
Ketones, UA: NEGATIVE
Leukocytes, UA: NEGATIVE
Nitrite, UA: NEGATIVE
Protein, UA: NEGATIVE
Spec Grav, UA: 1.025 (ref 1.010–1.025)
Urobilinogen, UA: 0.2 E.U./dL
pH, UA: 6 (ref 5.0–8.0)

## 2020-02-12 NOTE — Patient Instructions (Addendum)
We have checked the urine today and there are no signs of infection so we will send this for culture. In the meantime increase fluids to help.   We are sending this off for STD check and will call you back about the results of this.   We are checking the labs today and will get back with you on the results.

## 2020-02-12 NOTE — Progress Notes (Signed)
° °  Subjective:   Patient ID: Gabriella Porter, female    DOB: November 14, 1960, 59 y.o.   MRN: [id number]  HPI The patient is a 59 YO female coming in for concerns about urinary burning (started a few days ago, denies stomach or back pain, denies fevers or chills, denies nausea or vomiting, overall slightly better, has taken some otc and drinking more fluids) and hair thinning (she wants to make sure blood work okay, is taking biotin otc and feels that this has helped slightly, denies heat or cold intolerance, denies excessive urination or thirst) and depression (she was concerned about her doctor raising her wellbutrin without checking labs for anything so she stopped taking this, overall is coping okay and does not want to change at this time, working with meditation and support network to help, denies SI/HI).   Review of Systems  Constitutional: Negative.   HENT: Negative.   Eyes: Negative.   Respiratory: Negative.  Negative for cough, chest tightness and shortness of breath.   Cardiovascular: Negative.  Negative for chest pain, palpitations and leg swelling.  Gastrointestinal: Positive for abdominal pain. Negative for abdominal distention, constipation, diarrhea, nausea and vomiting.  Endocrine:       Hair thinning  Genitourinary: Positive for dysuria, frequency and urgency.  Musculoskeletal: Negative.   Skin: Negative.   Neurological: Negative.   Psychiatric/Behavioral: Positive for dysphoric mood.    Objective:  Physical Exam Constitutional:      Appearance: She is well-developed.  HENT:     Head: Normocephalic and atraumatic.     Comments: Hair does appear to be receding in several locations and thinning overall Cardiovascular:     Rate and Rhythm: Normal rate and regular rhythm.  Pulmonary:     Effort: Pulmonary effort is normal. No respiratory distress.     Breath sounds: Normal breath sounds. No wheezing or rales.  Abdominal:     General: Bowel sounds are normal. There is no  distension.     Palpations: Abdomen is soft.     Tenderness: There is no abdominal tenderness. There is no rebound.  Musculoskeletal:     Cervical back: Normal range of motion.  Skin:    General: Skin is warm and dry.  Neurological:     Mental Status: She is alert and oriented to person, place, and time.     Coordination: Coordination normal.     Vitals:   02/12/20 0950  BP: 118/82  Pulse: 87  Temp: 98.1 F (36.7 C)  TempSrc: Oral  SpO2: 97%  Weight: 195 lb (88.5 kg)    This visit occurred during the SARS-CoV-2 public health emergency.  Safety protocols were in place, including screening questions prior to the visit, additional usage of staff PPE, and extensive cleaning of exam room while observing appropriate contact time as indicated for disinfecting solutions.   Assessment & Plan:

## 2020-02-13 ENCOUNTER — Encounter: Payer: Self-pay | Admitting: Internal Medicine

## 2020-02-13 DIAGNOSIS — L659 Nonscarring hair loss, unspecified: Secondary | ICD-10-CM | POA: Insufficient documentation

## 2020-02-13 LAB — COMPREHENSIVE METABOLIC PANEL
AG Ratio: 1.4 (calc) (ref 1.0–2.5)
ALT: 31 U/L — ABNORMAL HIGH (ref 6–29)
AST: 21 U/L (ref 10–35)
Albumin: 4.1 g/dL (ref 3.6–5.1)
Alkaline phosphatase (APISO): 100 U/L (ref 37–153)
BUN: 13 mg/dL (ref 7–25)
CO2: 30 mmol/L (ref 20–32)
Calcium: 9.3 mg/dL (ref 8.6–10.4)
Chloride: 104 mmol/L (ref 98–110)
Creat: 0.74 mg/dL (ref 0.50–1.05)
Globulin: 3 g/dL (calc) (ref 1.9–3.7)
Glucose, Bld: 92 mg/dL (ref 65–99)
Potassium: 4.2 mmol/L (ref 3.5–5.3)
Sodium: 139 mmol/L (ref 135–146)
Total Bilirubin: 0.8 mg/dL (ref 0.2–1.2)
Total Protein: 7.1 g/dL (ref 6.1–8.1)

## 2020-02-13 LAB — CBC
HCT: 41.3 % (ref 35.0–45.0)
Hemoglobin: 13.9 g/dL (ref 11.7–15.5)
MCH: 29 pg (ref 27.0–33.0)
MCHC: 33.7 g/dL (ref 32.0–36.0)
MCV: 86.2 fL (ref 80.0–100.0)
MPV: 12.1 fL (ref 7.5–12.5)
Platelets: 297 10*3/uL (ref 140–400)
RBC: 4.79 10*6/uL (ref 3.80–5.10)
RDW: 13.7 % (ref 11.0–15.0)
WBC: 8.5 10*3/uL (ref 3.8–10.8)

## 2020-02-13 LAB — VITAMIN D 25 HYDROXY (VIT D DEFICIENCY, FRACTURES): Vit D, 25-Hydroxy: 49 ng/mL (ref 30–100)

## 2020-02-13 LAB — LIPID PANEL
Cholesterol: 148 mg/dL (ref <200)
HDL: 48 mg/dL — ABNORMAL LOW (ref >=50)
LDL Cholesterol (Calc): 86 mg/dL (calc)
Non-HDL Cholesterol (Calc): 100 mg/dL (calc) (ref <130)
Total CHOL/HDL Ratio: 3.1 (calc) (ref <5.0)
Triglycerides: 59 mg/dL (ref <150)

## 2020-02-13 LAB — HEMOGLOBIN A1C
Hgb A1c MFr Bld: 5.4 % of total Hgb (ref <5.7)
Mean Plasma Glucose: 108 (calc)
eAG (mmol/L): 6 (calc)

## 2020-02-13 LAB — TSH: TSH: 1.43 mIU/L (ref 0.40–4.50)

## 2020-02-13 LAB — VITAMIN B12: Vitamin B-12: 853 pg/mL (ref 200–1100)

## 2020-02-13 NOTE — Assessment & Plan Note (Signed)
Checking labs to rule out metabolic condition or vitamin deficiency. Okay to continue biotin since she feels it is helping.

## 2020-02-13 NOTE — Assessment & Plan Note (Signed)
POC U/A done in office not consistent with infection. She requests STD screening excluding HIV screening. Ordered urine culture to rule out subacute infection.

## 2020-02-13 NOTE — Assessment & Plan Note (Addendum)
Did stop taking wellbutrin without advice of her provider and seems to be doing okay. She would like to hold off on additional medication at this time and discuss with her provider. Advised of other coping skills to use.

## 2020-02-14 LAB — GC/CHLAMYDIA PROBE AMP
Chlamydia trachomatis, NAA: NEGATIVE
Neisseria Gonorrhoeae by PCR: NEGATIVE

## 2020-02-21 LAB — HM MAMMOGRAPHY

## 2020-02-25 ENCOUNTER — Encounter: Payer: Self-pay | Admitting: Family

## 2020-02-25 NOTE — Progress Notes (Signed)
Outside notes received. Information abstracted. Notes sent to scan.  

## 2020-03-01 ENCOUNTER — Ambulatory Visit: Payer: Federal, State, Local not specified - PPO | Attending: Internal Medicine

## 2020-03-01 ENCOUNTER — Other Ambulatory Visit: Payer: Self-pay

## 2020-03-01 DIAGNOSIS — Z20822 Contact with and (suspected) exposure to covid-19: Secondary | ICD-10-CM | POA: Diagnosis not present

## 2020-03-02 LAB — SARS-COV-2, NAA 2 DAY TAT

## 2020-03-02 LAB — NOVEL CORONAVIRUS, NAA: SARS-CoV-2, NAA: NOT DETECTED

## 2020-04-20 IMAGING — MR MRI LUMBAR SPINE WITHOUT CONTRAST
5 series · 46 of 48 positions shown · non-contrast
Comparison: Radiographs 07/12/2018

CLINICAL DATA: Chronic low back pain with left hip and leg pain.
Symptoms for 2 years. Symptoms worsening over the past 2 weeks.

EXAM:
MRI LUMBAR SPINE WITHOUT CONTRAST
TECHNIQUE: Multiplanar, multisequence MR imaging of the lumbar spine was
performed. No intravenous contrast was administered.

[Series 3: T2 · sagittal · 4.0mm · 0.88mm/px · 6 of 13 slices shown (1 of 2)]
[im 1/13]
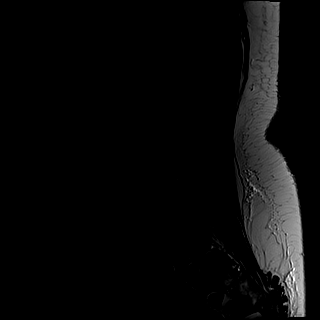
[im 3/13]
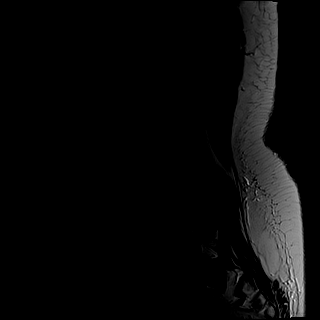
[im 5/13]
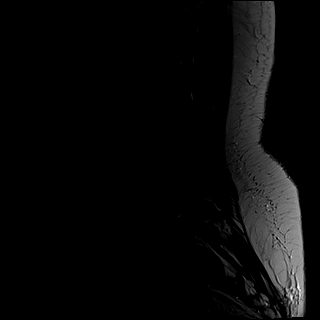
[im 8/13]
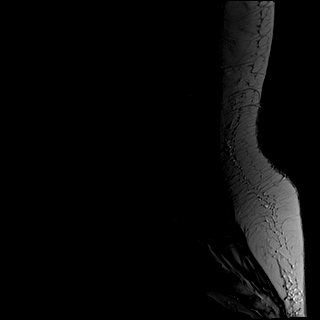
[im 10/13]
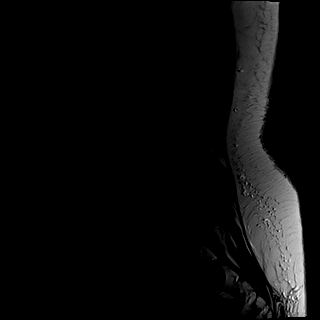
[im 13/13]
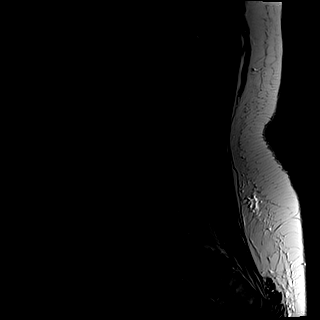

[Series 4: T1 · sagittal · 4.0mm · 0.88mm/px · 5 of 13 slices shown (1 of 2)]
[im 1/13]
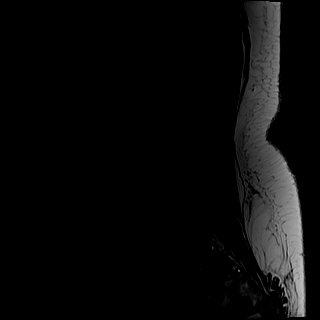
[im 4/13]
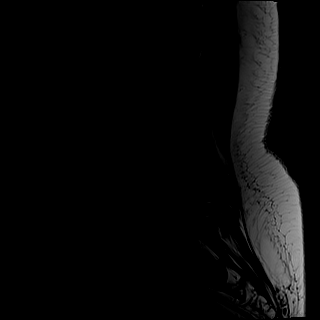
[im 7/13]
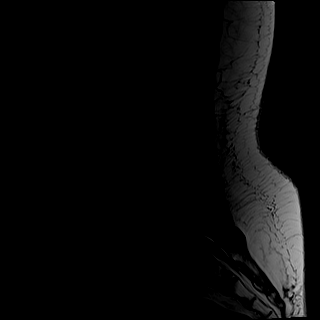
[im 10/13]
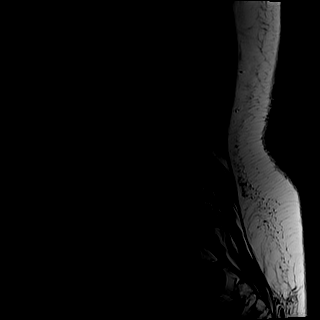
[im 13/13]
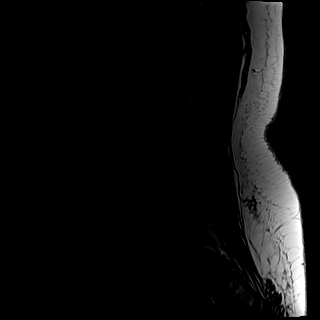

[Series 5: tirm sag · sagittal · 4.0mm · 0.55mm/px · 5 of 13 slices shown]
[im 1/13]
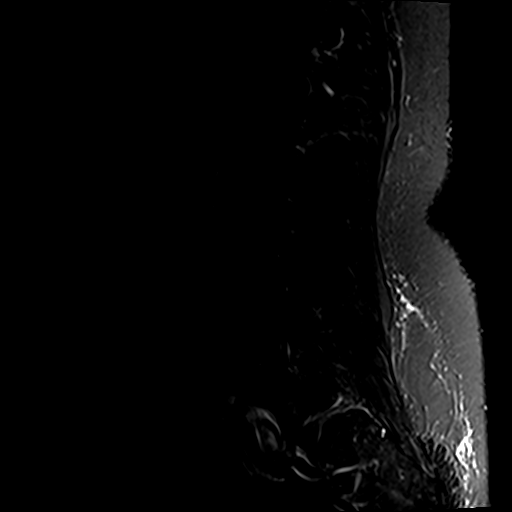
[im 4/13]
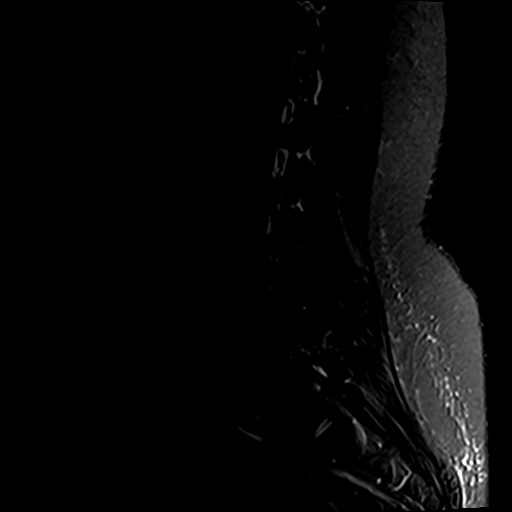
[im 7/13]
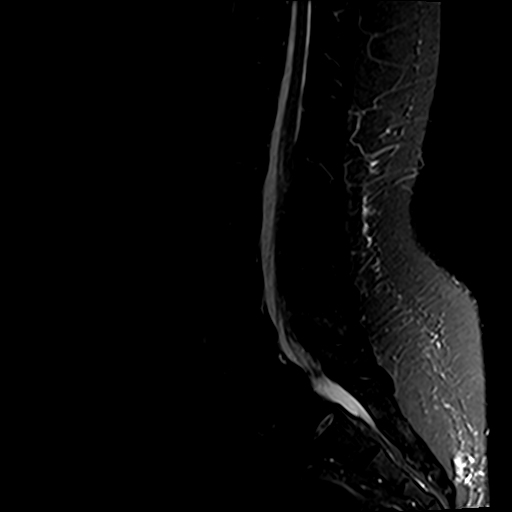
[im 10/13]
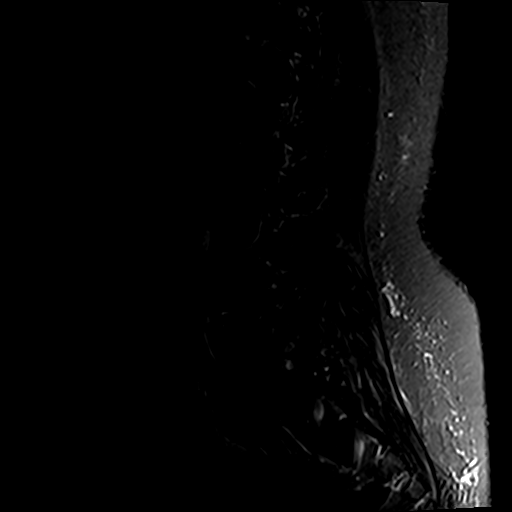
[im 13/13]
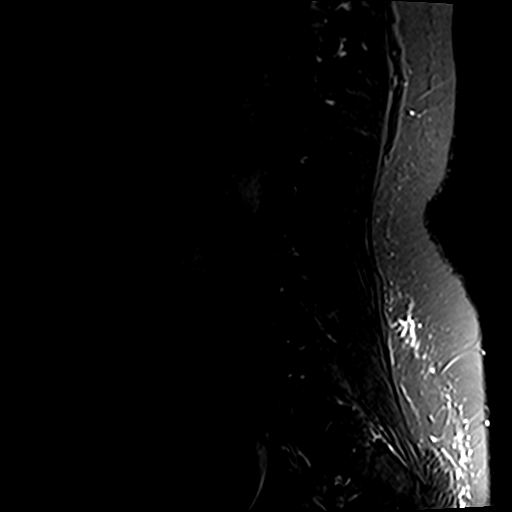

[Series 6: T1 · axial · 4.0mm · 0.78mm/px · z∈[-119,+97]mm · 14 of 40 slices shown (2 of 2)]
[im 1/40]
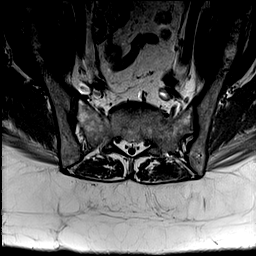
[im 3/40]
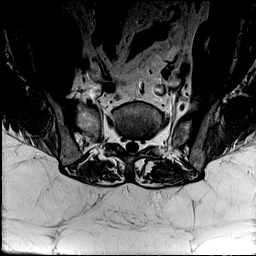
[im 6/40]
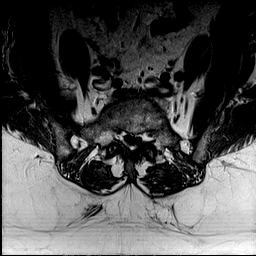
[im 8/40]
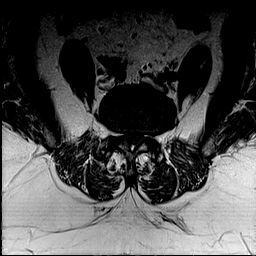
[im 11/40]
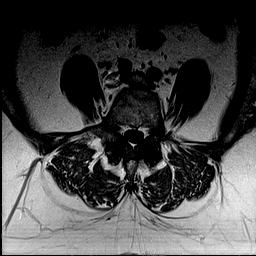
[im 14/40]
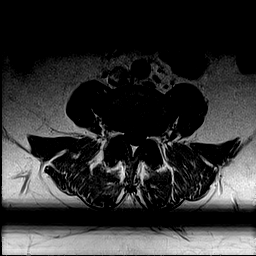
[im 16/40]
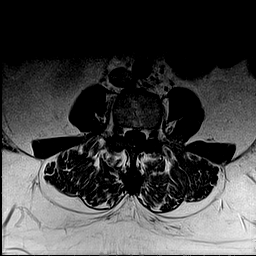
[im 19/40]
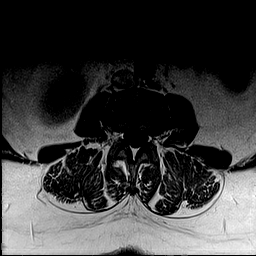
[im 21/40]
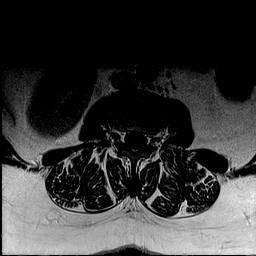
[im 24/40]
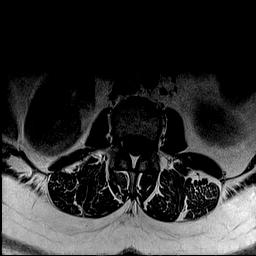
[im 27/40]
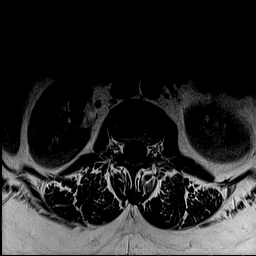
[im 29/40]
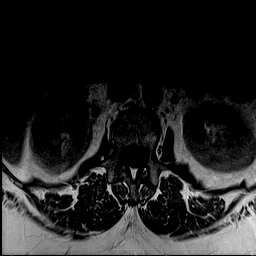
[im 34/40]
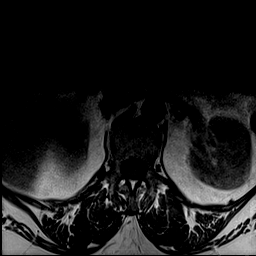
[im 40/40]
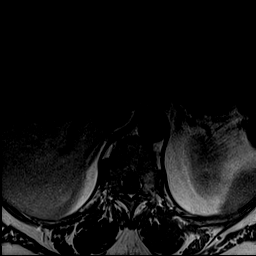

[Series 7: T2 · axial · 4.0mm · 0.78mm/px · z∈[-119,+97]mm · 16 of 40 slices shown (2 of 2)]
[im 1/40]
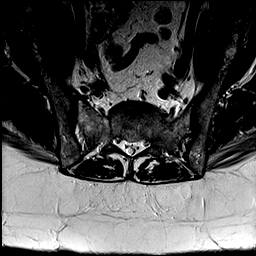
[im 3/40]
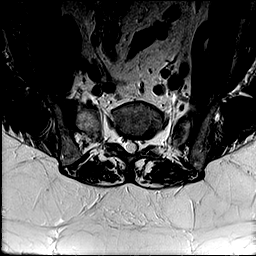
[im 6/40]
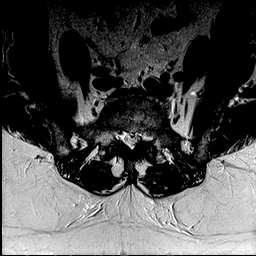
[im 8/40]
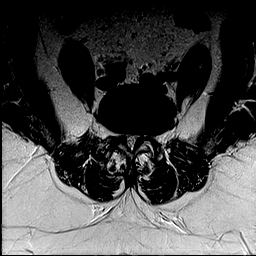
[im 11/40]
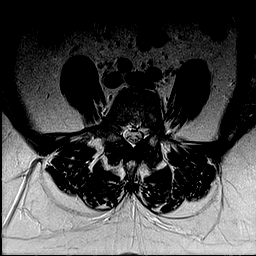
[im 14/40]
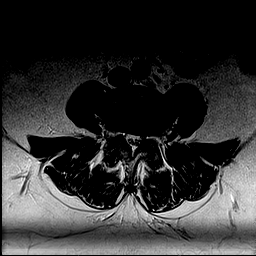
[im 16/40]
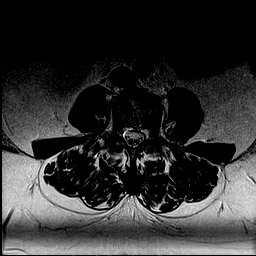
[im 19/40]
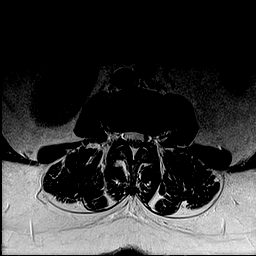
[im 21/40]
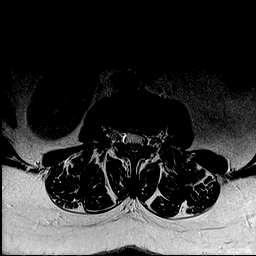
[im 24/40]
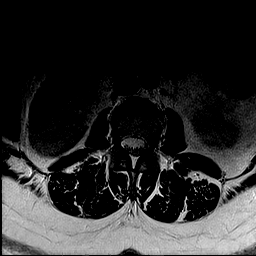
[im 27/40]
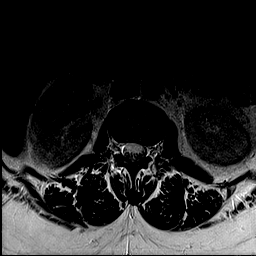
[im 29/40]
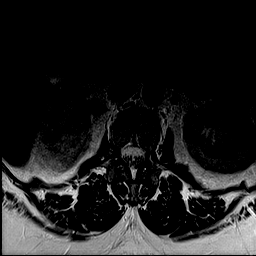
[im 32/40]
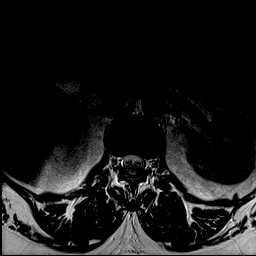
[im 34/40]
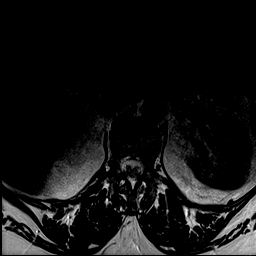
[im 37/40]
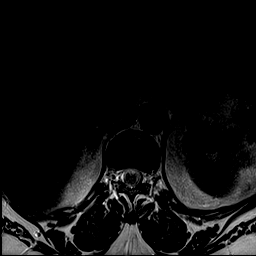
[im 40/40]
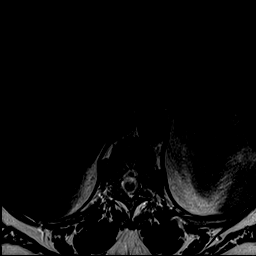

[46 of 48 positions shown; findings below may reference images not displayed]

FINDINGS: Segmentation: There are five lumbar type vertebral bodies. The last
full intervertebral disc space is labeled L5-S1. This correlates
with the plain films. There is a small remnant disc space at S1-2.

Alignment: Degenerative anterolisthesis of L5. The other vertebral
bodies are normally aligned.

Vertebrae:  Normal marrow signal.  No bone lesions or fractures.

Conus medullaris and cauda equina: Conus extends to the L2 level.
Conus and cauda equina appear normal.

Paraspinal and other soft tissues: No significant paraspinal or
retroperitoneal findings.

Disc levels:

T12-L1: No significant findings.

L1-2: No significant findings.

L2-3: No significant findings.

L3-4: No significant findings.

L4-5: Bulging annulus and moderate facet disease with flattening of
the ventral thecal sac and mild bilateral lateral recess stenosis.
No foraminal stenosis.

L5-S1: Bulging uncovered disc along with osteophytic ridging, short
pedicles and advanced facet disease contributing to moderate spinal
and bilateral lateral recess stenosis. Mild foraminal encroachment
bilaterally but no significant stenosis.
IMPRESSION: 1. Mild bilateral lateral recess stenosis at L4-5.
2. Degenerative anterolisthesis of L5 due to advanced facet disease.
3. Moderate multifactorial spinal and bilateral lateral recess
stenosis at L5-S1. There is also mild foraminal encroachment
bilaterally.

## 2020-06-24 ENCOUNTER — Other Ambulatory Visit: Payer: Self-pay | Admitting: Family

## 2020-06-29 ENCOUNTER — Ambulatory Visit: Payer: Federal, State, Local not specified - PPO | Admitting: Family

## 2020-07-05 ENCOUNTER — Encounter: Payer: Self-pay | Admitting: Family

## 2020-07-05 ENCOUNTER — Ambulatory Visit: Payer: Federal, State, Local not specified - PPO | Admitting: Family

## 2020-07-05 ENCOUNTER — Other Ambulatory Visit: Payer: Self-pay

## 2020-07-05 VITALS — BP 120/82 | HR 81 | Temp 98.1°F | Ht 59.0 in | Wt 205.0 lb

## 2020-07-05 DIAGNOSIS — M5441 Lumbago with sciatica, right side: Secondary | ICD-10-CM

## 2020-07-05 DIAGNOSIS — M25561 Pain in right knee: Secondary | ICD-10-CM

## 2020-07-05 DIAGNOSIS — F32A Depression, unspecified: Secondary | ICD-10-CM

## 2020-07-05 DIAGNOSIS — I10 Essential (primary) hypertension: Secondary | ICD-10-CM

## 2020-07-05 DIAGNOSIS — G8929 Other chronic pain: Secondary | ICD-10-CM

## 2020-07-05 DIAGNOSIS — Z1211 Encounter for screening for malignant neoplasm of colon: Secondary | ICD-10-CM

## 2020-07-05 DIAGNOSIS — M25562 Pain in left knee: Secondary | ICD-10-CM

## 2020-07-05 MED ORDER — AMOXICILLIN-POT CLAVULANATE 875-125 MG PO TABS: 1.0000 | ORAL_TABLET | Freq: Two times a day (BID) | ORAL | 0 refills | Status: AC

## 2020-07-05 MED ORDER — CITALOPRAM HYDROBROMIDE 20 MG PO TABS
20.0000 mg | ORAL_TABLET | Freq: Every day | ORAL | 0 refills | Status: AC
Start: 2020-07-05 — End: ?

## 2020-07-05 MED ORDER — IBUPROFEN-FAMOTIDINE 800-26.6 MG PO TABS: 1.0000 | ORAL_TABLET | Freq: Three times a day (TID) | ORAL | 0 refills | Status: DC

## 2020-07-05 MED ORDER — NYSTATIN-TRIAMCINOLONE 100000-0.1 UNIT/GM-% EX CREA: 1.0000 "application " | TOPICAL_CREAM | Freq: Two times a day (BID) | CUTANEOUS | 0 refills | Status: DC

## 2020-07-05 MED ORDER — IBUPROFEN 800 MG PO TABS: 800.0000 mg | ORAL_TABLET | Freq: Three times a day (TID) | ORAL | 0 refills | Status: DC | PRN

## 2020-07-05 MED ORDER — LISINOPRIL-HYDROCHLOROTHIAZIDE 20-12.5 MG PO TABS: 1.0000 | ORAL_TABLET | Freq: Every day | ORAL | 0 refills | Status: DC

## 2020-07-05 NOTE — Progress Notes (Signed)
Gabriella Porter is a 59 y.o. female with the following history as recorded in [employer]:  Patient Active Problem List   Diagnosis Date Noted  . Hair thinning 02/13/2020  . Dysuria 11/10/2019  . Vaginal discharge 11/10/2019  . Acute bilateral low back pain without sciatica 11/10/2019  . UTI (urinary tract infection) 11/11/2018  . Left hand pain 07/22/2018  . Chronic pain of both knees 07/22/2018  . Essential hypertension 03/27/2018  . Extrinsic asthma 03/27/2018  . Depression 03/27/2018  . Chronic low back pain 03/27/2018    Current Outpatient Medications  Medication Sig Dispense Refill  . albuterol (PROVENTIL) (2.5 MG/3ML) 0.083% nebulizer solution TAKE 3 MLS (1VIAL) BY NEBULIZATION EVERY 6 HOURS AS NEEDED FOR WHEEZING OR SHORTNESS OF BREATH. 150 mL 1  . albuterol (VENTOLIN HFA) 108 (90 Base) MCG/ACT inhaler INHALE 2 PUFFS INTO THE LUNGS EVERY 6 HOURS AS NEEDED FOR WHEEZE OR SHORTNESS OF BREATH 6.7 g 2  . aspirin EC 81 MG tablet Take 1 tablet (81 mg total) by mouth daily.    Marland Kitchen latanoprost (XALATAN) 0.005 % ophthalmic solution 1 drop at bedtime.    . Multiple Vitamins-Minerals (ONE DAILY CALCIUM/IRON PO) Take 1 each by mouth daily.    . Vitamin D3 (VITAMIN D) 25 MCG tablet Take 1,000 Units by mouth daily.    Marland Kitchen amoxicillin-clavulanate (AUGMENTIN) 875-125 MG tablet Take 1 tablet by mouth 2 (two) times daily for 7 days. 14 tablet 0  . citalopram (CELEXA) 20 MG tablet Take 1 tablet (20 mg total) by mouth daily. 90 tablet 0  . ibuprofen (ADVIL) 800 MG tablet Take 1 tablet (800 mg total) by mouth every 8 (eight) hours as needed. 90 tablet 0  . lisinopril-hydrochlorothiazide (ZESTORETIC) 20-12.5 MG tablet Take 1 tablet by mouth daily. 90 tablet 0  . nystatin-triamcinolone (MYCOLOG II) cream Apply 1 application topically 2 (two) times daily. 60 g 0   No current facility-administered medications for this visit.    Allergies: Sulfa antibiotics  Past Medical History:  Diagnosis Date  . Arthritis    . Asthma   . Depression   . GERD (gastroesophageal reflux disease)   . Glaucoma 10/04/2018  . History of recurrent UTI (urinary tract infection)   . Hypertension     Past Surgical History:  Procedure Laterality Date  . ABDOMINAL HYSTERECTOMY  2003    Family History  Problem Relation Age of Onset  . Diabetes Mother   . Heart disease Mother   . Hyperlipidemia Mother   . Hypertension Mother   . Stroke Mother   . Diabetes Father   . Arthritis Father   . Heart disease Father   . Kidney disease Father   . Cancer Sister   . Diabetes Maternal Grandmother   . Heart disease Maternal Grandmother   . Hypertension Maternal Grandmother   . Diabetes Maternal Grandfather   . Heart disease Maternal Grandfather   . Hypertension Maternal Grandfather   . Diabetes Paternal Grandmother   . Heart disease Paternal Grandmother   . Hypertension Paternal Grandmother   . Diabetes Paternal Grandfather   . Heart disease Paternal Grandfather   . Hypertension Paternal Grandfather   . Hyperlipidemia Sister   . Bipolar disorder Sister     Social History   Tobacco Use  . Smoking status: Never Smoker  . Smokeless tobacco: Never Used  Substance Use Topics  . Alcohol use: Yes    Subjective:  Patient will be moving to [place] in the next  month; would like to  re-start her Citalopram that she has taken in the past- not currently on Wellbutrin; also wants to go back on the Lisinopril HCT that she has taken in the past; " I just don't like the medication I am on as much." Denies any chest pain, shortness of breath, blurred vision or headache. Requesting referral for colonoscopy as well in the hopes that can be done before she moves;    Objective:  Vitals:   07/05/20 0840  BP: 120/82  Pulse: 81  Temp: 98.1 F (36.7 C)  TempSrc: Oral  SpO2: 95%  Weight: 205 lb (93 kg)  Height: 4\' 11"  (1.499 m)    General: Well developed, well nourished, in no acute distress  Skin : Warm and dry.  Head:  Normocephalic and atraumatic  Lungs: Respirations unlabored; clear to auscultation bilaterally without wheeze, rales, rhonchi  CVS exam: normal rate and regular rhythm.  Neurologic: Alert and oriented; speech intact; face symmetrical; moves all extremities well; CNII-XII intact without focal deficit   Assessment:  1. Essential hypertension   2. Depression, unspecified depression type   3. Encounter for screening colonoscopy   4. Chronic bilateral low back pain with right-sided sciatica   5. Chronic pain of both knees     Plan:  1. D/C Olmesartan per patient request; re-start Lisinopril HCT; she understands to follow-up here or with new provider in within 2 months for re-check; 2. Re-start Citalopram 20 mg daily; 3. Order updated; 4. Refill given on Ibuprofen 800 mg tid prn;  Plan to establish with new PCP in [place] once she gets moved;   This visit occurred during the SARS-CoV-2 public health emergency.  Safety protocols were in place, including screening questions prior to the visit, additional usage of staff PPE, and extensive cleaning of exam room while observing appropriate contact time as indicated for disinfecting solutions.     No follow-ups on file.  Orders Placed This Encounter  Procedures  . Ambulatory referral to Gastroenterology    Referral Priority:   Routine    Referral Type:   Consultation    Referral Reason:   Specialty Services Required    Number of Visits Requested:   1    Requested Prescriptions   Signed Prescriptions Disp Refills  . lisinopril-hydrochlorothiazide (ZESTORETIC) 20-12.5 MG tablet 90 tablet 0    Sig: Take 1 tablet by mouth daily.  . citalopram (CELEXA) 20 MG tablet 90 tablet 0    Sig: Take 1 tablet (20 mg total) by mouth daily.  [place] nystatin-triamcinolone (MYCOLOG II) cream 60 g 0    Sig: Apply 1 application topically 2 (two) times daily.  Marland Kitchen amoxicillin-clavulanate (AUGMENTIN) 875-125 MG tablet 14 tablet 0    Sig: Take 1 tablet by mouth 2 (two)  times daily for 7 days.  Marland Kitchen ibuprofen (ADVIL) 800 MG tablet 90 tablet 0    Sig: Take 1 tablet (800 mg total) by mouth every 8 (eight) hours as needed.

## 2020-07-19 ENCOUNTER — Encounter: Payer: Self-pay | Admitting: Family

## 2022-11-24 ENCOUNTER — Encounter (HOSPITAL_COMMUNITY): Payer: Self-pay

## 2022-11-24 ENCOUNTER — Ambulatory Visit (HOSPITAL_COMMUNITY)
Admission: EM | Admit: 2022-11-24 | Discharge: 2022-11-24 | Disposition: A | Discharge status: Home / Self Care | Payer: Federal, State, Local not specified - PPO | Attending: Emergency Medicine | Admitting: Emergency Medicine

## 2022-11-24 DIAGNOSIS — J4521 Mild intermittent asthma with (acute) exacerbation: Secondary | ICD-10-CM | POA: Diagnosis present

## 2022-11-24 DIAGNOSIS — N3001 Acute cystitis with hematuria: Secondary | ICD-10-CM | POA: Diagnosis not present

## 2022-11-24 LAB — POCT URINALYSIS DIP (MANUAL ENTRY)
Bilirubin, UA: NEGATIVE
Glucose, UA: NEGATIVE mg/dL
Ketones, POC UA: NEGATIVE mg/dL
Nitrite, UA: NEGATIVE
Protein Ur, POC: NEGATIVE mg/dL
Spec Grav, UA: 1.025 (ref 1.010–1.025)
Urobilinogen, UA: 0.2 E.U./dL
pH, UA: 5 (ref 5.0–8.0)

## 2022-11-24 MED ORDER — PREDNISONE 20 MG PO TABS: 40.0000 mg | ORAL_TABLET | Freq: Every day | ORAL | 0 refills | Status: AC

## 2022-11-24 MED ORDER — BENZONATATE 100 MG PO CAPS: 100.0000 mg | ORAL_CAPSULE | Freq: Three times a day (TID) | ORAL | 0 refills | Status: DC | PRN

## 2022-11-24 MED ORDER — ALBUTEROL SULFATE HFA 108 (90 BASE) MCG/ACT IN AERS: 2.0000 | INHALATION_SPRAY | Freq: Four times a day (QID) | RESPIRATORY_TRACT | 1 refills | Status: DC | PRN

## 2022-11-24 MED ORDER — CEPHALEXIN 500 MG PO CAPS: 500.0000 mg | ORAL_CAPSULE | Freq: Two times a day (BID) | ORAL | 0 refills | Status: AC

## 2022-11-24 NOTE — ED Triage Notes (Signed)
Patient reports that she has been having dysuria x 1 week and treating it with cranberry juice.  Patient also c/o SOB, right ear pain, intermittent fever, and chest congestion x 1 week.  Patient states she has been taking Tylenol Sinus medication and the last dose was 2 days ago.

## 2022-11-24 NOTE — Discharge Instructions (Addendum)
I am treating you for urinary tract infection.  Please take the antibiotic (Keflex) as prescribed, with food to avoid upset stomach. Increase water intake.    For cough, please take medications as prescribed. Please use the inhaler 3 times daily (every 6 hours) for the next several days. Prednisone 40 mg daily for the next 5 days. You can use the tessalon cough pills three times daily as well.   Please return if symptoms persist or worsen

## 2022-11-24 NOTE — ED Provider Notes (Signed)
[facility]    CSN: [id number] Arrival date & time: 11/24/22  1439     History   Chief Complaint Chief Complaint  Patient presents with   Dysuria   Shortness of Breath   Otalgia   Fever   chest congestion    HPI Gabriella Porter is a 62 y.o. female.  Reports 4-5 day history of chest congestion and cough Some shortness of breath with exertion. No wheezing. Tactile fever at night, no temps measured.  Has used tylenol sinus/cold a few times Hx asthma. Tried nebulizer at home. Ran out of inhaler.  Also reporting dysuria x 1 week. Has used cranberry juice. No abd pain or flank pain. Hx of recurrent UTI  Past Medical History:  Diagnosis Date   Arthritis    Asthma    Depression    GERD (gastroesophageal reflux disease)    Glaucoma 10/04/2018   History of recurrent UTI (urinary tract infection)    Hypertension     Patient Active Problem List   Diagnosis Date Noted   Hair thinning 02/13/2020   Dysuria 11/10/2019   Vaginal discharge 11/10/2019   Acute bilateral low back pain without sciatica 11/10/2019   UTI (urinary tract infection) 11/11/2018   Left hand pain 07/22/2018   Chronic pain of both knees 07/22/2018   Essential hypertension 03/27/2018   Extrinsic asthma 03/27/2018   Depression 03/27/2018   Chronic low back pain 03/27/2018    Past Surgical History:  Procedure Laterality Date   ABDOMINAL HYSTERECTOMY  2003   CARPAL TUNNEL RELEASE Bilateral     OB History   No obstetric history on file.      Home Medications    Prior to Admission medications   Medication Sig Start Date End Date Taking? Authorizing Provider  albuterol (VENTOLIN HFA) 108 (90 Base) MCG/ACT inhaler Inhale 2 puffs into the lungs every 6 (six) hours as needed for wheezing or shortness of breath. 11/24/22  Yes Jonpaul Lumm, Lurena Joiner, PA-C  benzonatate (TESSALON) 100 MG capsule Take 1 capsule (100 mg total) by mouth 3 (three) times daily as needed for cough. 11/24/22  Yes Archibald Marchetta, Lurena Joiner,  PA-C  cephALEXin (KEFLEX) 500 MG capsule Take 1 capsule (500 mg total) by mouth 2 (two) times daily for 5 days. 11/24/22 11/29/22 Yes Taneeka Curtner, Lurena Joiner, PA-C  predniSONE (DELTASONE) 20 MG tablet Take 2 tablets (40 mg total) by mouth daily with breakfast for 5 days. 11/24/22 11/29/22 Yes Jashawn Floyd, Lurena Joiner, PA-C  albuterol (PROVENTIL) (2.5 MG/3ML) 0.083% nebulizer solution TAKE 3 MLS (1VIAL) BY NEBULIZATION EVERY 6 HOURS AS NEEDED FOR WHEEZING OR SHORTNESS OF BREATH. 06/25/20   Olive Bass, FNP  aspirin EC 81 MG tablet Take 1 tablet (81 mg total) by mouth daily. 03/29/18   Pricilla Riffle, MD  citalopram (CELEXA) 20 MG tablet Take 1 tablet (20 mg total) by mouth daily. 07/05/20   Olive Bass, FNP  latanoprost (XALATAN) 0.005 % ophthalmic solution 1 drop at bedtime. 08/18/19   [provider]  lisinopril-hydrochlorothiazide (ZESTORETIC) 20-12.5 MG tablet Take 1 tablet by mouth daily. 07/05/20   Olive Bass, FNP  Multiple Vitamins-Minerals (ONE DAILY CALCIUM/IRON PO) Take 1 each by mouth daily.    [provider]  Vitamin D3 (VITAMIN D) 25 MCG tablet Take 1,000 Units by mouth daily.    [provider]    Family History Family History  Problem Relation Age of Onset   Diabetes Mother    Heart disease Mother    Hyperlipidemia Mother  Hypertension Mother    Stroke Mother    Diabetes Father    Arthritis Father    Heart disease Father    Kidney disease Father    Cancer Sister    Diabetes Maternal Grandmother    Heart disease Maternal Grandmother    Hypertension Maternal Grandmother    Diabetes Maternal Grandfather    Heart disease Maternal Grandfather    Hypertension Maternal Grandfather    Diabetes Paternal Grandmother    Heart disease Paternal Grandmother    Hypertension Paternal Grandmother    Diabetes Paternal Grandfather    Heart disease Paternal Grandfather    Hypertension Paternal Grandfather    Hyperlipidemia Sister    Bipolar  disorder Sister     Social History Social History   Tobacco Use   Smoking status: Never   Smokeless tobacco: Never  Vaping Use   Vaping Use: Never used  Substance Use Topics   Alcohol use: Not Currently   Drug use: Never     Allergies   Sulfa antibiotics   Review of Systems Review of Systems As per HPI  Physical Exam Triage Vital Signs ED Triage Vitals  Enc Vitals Group     BP 11/24/22 1516 126/76     Pulse Rate 11/24/22 1516 76     Resp 11/24/22 1516 16     Temp 11/24/22 1516 98 F (36.7 C)     Temp Source 11/24/22 1516 Oral     SpO2 11/24/22 1516 96 %     Weight --      Height --      Head Circumference --      Peak Flow --      Pain Score 11/24/22 1518 0     Pain Loc --      Pain Edu? --      Excl. in GC? --    No data found.  Updated Vital Signs BP 126/76 (BP Location: Left Arm)   Pulse 76   Temp 98 F (36.7 C) (Oral)   Resp 16   LMP 08/07/2001   SpO2 96%   Physical Exam Vitals and nursing note reviewed.  Constitutional:      General: She is not in acute distress.    Appearance: She is not ill-appearing.  HENT:     Nose: No congestion or rhinorrhea.     Mouth/Throat:     Mouth: Mucous membranes are moist.     Pharynx: Oropharynx is clear. No posterior oropharyngeal erythema.  Eyes:     Conjunctiva/sclera: Conjunctivae normal.  Cardiovascular:     Rate and Rhythm: Normal rate and regular rhythm.     Pulses: Normal pulses.     Heart sounds: Normal heart sounds.  Pulmonary:     Effort: Pulmonary effort is normal.     Breath sounds: Normal breath sounds.     Comments: Very faint expiratory wheezes in upper lobes. Speaks in full sentences. No acute distress Abdominal:     Tenderness: There is no abdominal tenderness. There is no right CVA tenderness, left CVA tenderness, guarding or rebound.  Musculoskeletal:     Cervical back: Normal range of motion.  Lymphadenopathy:     Cervical: No cervical adenopathy.  Skin:    General: Skin is  warm and dry.  Neurological:     Mental Status: She is alert and oriented to person, place, and time.     UC Treatments / Results  Labs (all labs ordered are listed, but only abnormal results are displayed)  Labs Reviewed  POCT URINALYSIS DIP (MANUAL ENTRY) - Abnormal; Notable for the following components:      Result Value   Blood, UA trace-intact (*)    Leukocytes, UA Trace (*)    All other components within normal limits  URINE CULTURE    EKG   Radiology No results found.  Procedures Procedures (including critical care time)  Medications Ordered in UC Medications - No data to display  Initial Impression / Assessment and Plan / UC Course  I have reviewed the triage vital signs and the nursing notes.  Pertinent labs & imaging results that were available during my care of the patient were reviewed by me and considered in my medical decision making (see chart for details).  UA with trace leuks and RBCs. Culture pending. With history of recurrent UTI will treat with keflex BID x 5 days. Recommend increase water intake. No abd or CVA tenderness on exam. Afebrile.   Acute asthma exacerbation No indication for xray imaging today. Refilled inhaler, will use q6 hours prn. Prednisone 40 mg daily x 5. Tessalon TID PRN. Recommend to return or go to ED if symptoms persist/worsen. She does have PCP visit on 5/1 Patient agreeable to plan  Final Clinical Impressions(s) / UC Diagnoses   Final diagnoses:  Mild intermittent asthma with acute exacerbation  Acute cystitis with hematuria     Discharge Instructions      I am treating you for urinary tract infection.  Please take the antibiotic (Keflex) as prescribed, with food to avoid upset stomach. Increase water intake.    For cough, please take medications as prescribed. Please use the inhaler 3 times daily (every 6 hours) for the next several days. Prednisone 40 mg daily for the next 5 days. You can use the tessalon cough  pills three times daily as well.   Please return if symptoms persist or worsen     ED Prescriptions     Medication Sig Dispense Auth. Provider   cephALEXin (KEFLEX) 500 MG capsule Take 1 capsule (500 mg total) by mouth 2 (two) times daily for 5 days. 10 capsule Makenzie Vittorio, PA-C   predniSONE (DELTASONE) 20 MG tablet Take 2 tablets (40 mg total) by mouth daily with breakfast for 5 days. 10 tablet Juvon Teater, PA-C   albuterol (VENTOLIN HFA) 108 (90 Base) MCG/ACT inhaler Inhale 2 puffs into the lungs every 6 (six) hours as needed for wheezing or shortness of breath. 8 g Shoshanna Mcquitty, PA-C   benzonatate (TESSALON) 100 MG capsule Take 1 capsule (100 mg total) by mouth 3 (three) times daily as needed for cough. 21 capsule Luddie Boghosian, Lurena Joiner, PA-C      PDMP not reviewed this encounter.   Marlow Baars, [place] 11/24/22 1654

## 2022-11-26 LAB — URINE CULTURE: Culture: <10000 — AB

## 2023-02-06 ENCOUNTER — Other Ambulatory Visit (HOSPITAL_BASED_OUTPATIENT_CLINIC_OR_DEPARTMENT_OTHER): Payer: Self-pay

## 2023-02-06 ENCOUNTER — Other Ambulatory Visit (HOSPITAL_COMMUNITY): Payer: Self-pay

## 2023-02-12 ENCOUNTER — Other Ambulatory Visit (HOSPITAL_COMMUNITY): Payer: Self-pay

## 2023-02-12 MED ORDER — WEGOVY 0.5 MG/0.5ML ~~LOC~~ SOAJ
0.5000 mg | SUBCUTANEOUS | 1 refills | Status: DC
  Filled 2023-02-12: qty 2, 28d supply, fill #0
  Filled 2023-03-21: qty 2, 28d supply, fill #1

## 2023-04-20 ENCOUNTER — Other Ambulatory Visit (HOSPITAL_COMMUNITY): Payer: Self-pay

## 2023-04-20 MED ORDER — WEGOVY 0.25 MG/0.5ML ~~LOC~~ SOAJ
0.2500 mg | SUBCUTANEOUS | 0 refills | Status: DC
  Filled 2023-04-20: qty 2, 28d supply, fill #0

## 2023-04-20 MED ORDER — WEGOVY 0.5 MG/0.5ML ~~LOC~~ SOAJ
0.5000 mg | SUBCUTANEOUS | 1 refills | Status: DC
  Filled 2023-04-20: qty 2, 28d supply, fill #0

## 2023-05-07 ENCOUNTER — Other Ambulatory Visit (HOSPITAL_COMMUNITY): Payer: Self-pay

## 2023-05-07 MED ORDER — METRONIDAZOLE 500 MG PO TABS
500.0000 mg | ORAL_TABLET | Freq: Two times a day (BID) | ORAL | 0 refills | Status: DC
  Filled 2023-05-07: qty 14, 7d supply, fill #0

## 2024-03-11 ENCOUNTER — Ambulatory Visit (HOSPITAL_COMMUNITY)
Admission: EM | Admit: 2024-03-11 | Discharge: 2024-03-11 | Disposition: A | Discharge status: Home / Self Care | Attending: Family Medicine | Admitting: Family Medicine

## 2024-03-11 ENCOUNTER — Encounter (HOSPITAL_COMMUNITY): Payer: Self-pay

## 2024-03-11 DIAGNOSIS — W57XXXA Bitten or stung by nonvenomous insect and other nonvenomous arthropods, initial encounter: Secondary | ICD-10-CM | POA: Diagnosis not present

## 2024-03-11 DIAGNOSIS — L239 Allergic contact dermatitis, unspecified cause: Secondary | ICD-10-CM | POA: Diagnosis not present

## 2024-03-11 DIAGNOSIS — J4521 Mild intermittent asthma with (acute) exacerbation: Secondary | ICD-10-CM

## 2024-03-11 DIAGNOSIS — S1096XA Insect bite of unspecified part of neck, initial encounter: Secondary | ICD-10-CM | POA: Diagnosis not present

## 2024-03-11 MED ORDER — DEXAMETHASONE SODIUM PHOSPHATE 10 MG/ML IJ SOLN
INTRAMUSCULAR | Status: AC
  Filled 2024-03-11: qty 1

## 2024-03-11 MED ORDER — PREDNISONE 20 MG PO TABS: 40.0000 mg | ORAL_TABLET | Freq: Every day | ORAL | 0 refills | Status: AC

## 2024-03-11 MED ORDER — DEXAMETHASONE SODIUM PHOSPHATE 10 MG/ML IJ SOLN
10.0000 mg | Freq: Once | INTRAMUSCULAR | Status: AC
  Administered 2024-03-11: 10 mg via INTRAMUSCULAR

## 2024-03-11 MED ORDER — ALBUTEROL SULFATE HFA 108 (90 BASE) MCG/ACT IN AERS: 2.0000 | INHALATION_SPRAY | RESPIRATORY_TRACT | 0 refills | Status: DC | PRN

## 2024-03-11 NOTE — Discharge Instructions (Signed)
 Your vital signs were all normal.  You have been given a shot of dexamethasone  10 mg, steroid.   Albuterol  inhaler--do 2 puffs every 4 hours as needed for shortness of breath or wheezing  Take prednisone  20 mg--2 daily for 5 days  Take cetirizine/Zyrtec 10 mg--over-the-counter--1 tablet daily as needed for itching.

## 2024-03-11 NOTE — ED Provider Notes (Signed)
 [facility]    CSN: [id number] Arrival date & time: 03/11/24  1515      History   Chief Complaint Chief Complaint  Patient presents with   Insect Bite    HPI Gabriella Porter is a 63 y.o. female.   HPI Here for itching and redness that began 2 days ago.  She was out on a riding lawnmower and had forgotten to spray insect repellent on her neck and felt a sudden sting. Since then she has had spreading redness and burning and itching on her left posterior neck.  No fever.  No sensation of tongue or lip swelling or throat closing.  She has had some wheezing and shortness of breath, but that has been going on for longer than 2 days with her asthma bothering her more lately.  No nasal congestion  She is allergic to sulfa  Past medical history includes hypertension  Past Medical History:  Diagnosis Date   Arthritis    Asthma    Depression    GERD (gastroesophageal reflux disease)    Glaucoma 10/04/2018   History of recurrent UTI (urinary tract infection)    Hypertension     Patient Active Problem List   Diagnosis Date Noted   Hair thinning 02/13/2020   Dysuria 11/10/2019   Vaginal discharge 11/10/2019   Acute bilateral low back pain without sciatica 11/10/2019   UTI (urinary tract infection) 11/11/2018   Left hand pain 07/22/2018   Chronic pain of both knees 07/22/2018   Essential hypertension 03/27/2018   Extrinsic asthma 03/27/2018   Depression 03/27/2018   Chronic low back pain 03/27/2018    Past Surgical History:  Procedure Laterality Date   ABDOMINAL HYSTERECTOMY  2003   CARPAL TUNNEL RELEASE Bilateral     OB History   No obstetric history on file.      Home Medications    Prior to Admission medications   Medication Sig Start Date End Date Taking? Authorizing Provider  albuterol  (VENTOLIN  HFA) 108 (90 Base) MCG/ACT inhaler Inhale 2 puffs into the lungs every 4 (four) hours as needed for wheezing or shortness of breath. 03/11/24  Yes  Littie Chiem, Sharlet POUR, MD  losartan  (COZAAR ) 100 MG tablet TAKE 1 TABLET BY MOUTH EVERY DAY FOR 90 DAYS Oral; Duration: 90 Days 07/28/21  Yes [provider]  predniSONE  (DELTASONE ) 20 MG tablet Take 2 tablets (40 mg total) by mouth daily with breakfast for 5 days. 03/11/24 03/16/24 Yes Vonna Sharlet POUR, MD  aspirin  EC 81 MG tablet Take 1 tablet (81 mg total) by mouth daily. 03/29/18   Okey Vina GAILS, MD  citalopram  (CELEXA ) 20 MG tablet Take 1 tablet (20 mg total) by mouth daily. 07/05/20   Jason Leita Repine, FNP  latanoprost (XALATAN) 0.005 % ophthalmic solution 1 drop at bedtime. 08/18/19   [provider]  Multiple Vitamins-Minerals (ONE DAILY CALCIUM/IRON PO) Take 1 each by mouth daily.    [provider]  Vitamin D3 (VITAMIN D) 25 MCG tablet Take 1,000 Units by mouth daily.    [provider]    Family History Family History  Problem Relation Age of Onset   Diabetes Mother    Heart disease Mother    Hyperlipidemia Mother    Hypertension Mother    Stroke Mother    Diabetes Father    Arthritis Father    Heart disease Father    Kidney disease Father    Cancer Sister    Diabetes Maternal Grandmother  Heart disease Maternal Grandmother    Hypertension Maternal Grandmother    Diabetes Maternal Grandfather    Heart disease Maternal Grandfather    Hypertension Maternal Grandfather    Diabetes Paternal Grandmother    Heart disease Paternal Grandmother    Hypertension Paternal Grandmother    Diabetes Paternal Grandfather    Heart disease Paternal Grandfather    Hypertension Paternal Grandfather    Hyperlipidemia Sister    Bipolar disorder Sister     Social History Social History   Tobacco Use   Smoking status: Never   Smokeless tobacco: Never  Vaping Use   Vaping status: Never Used  Substance Use Topics   Alcohol use: Not Currently   Drug use: Never     Allergies   Sulfa antibiotics   Review of Systems Review of  Systems   Physical Exam Triage Vital Signs ED Triage Vitals [03/11/24 1552]  Encounter Vitals Group     BP 133/75     Girls Systolic BP Percentile      Girls Diastolic BP Percentile      Boys Systolic BP Percentile      Boys Diastolic BP Percentile      Pulse Rate 72     Resp 18     Temp 98.1 F (36.7 C)     Temp Source Oral     SpO2 100 %     Weight      Height      Head Circumference      Peak Flow      Pain Score      Pain Loc      Pain Education      Exclude from Growth Chart    No data found.  Updated Vital Signs BP 133/75 (BP Location: Right Arm)   Pulse 72   Temp 98.1 F (36.7 C) (Oral)   Resp 18   LMP 08/07/2001   SpO2 100%   Visual Acuity Right Eye Distance:   Left Eye Distance:   Bilateral Distance:    Right Eye Near:   Left Eye Near:    Bilateral Near:     Physical Exam Vitals reviewed.  Constitutional:      General: She is not in acute distress.    Appearance: She is not ill-appearing, toxic-appearing or diaphoretic.  HENT:     Mouth/Throat:     Mouth: Mucous membranes are moist.     Pharynx: No oropharyngeal exudate or posterior oropharyngeal erythema.     Comments: No tongue or lip swelling Eyes:     Extraocular Movements: Extraocular movements intact.     Conjunctiva/sclera: Conjunctivae normal.     Pupils: Pupils are equal, round, and reactive to light.  Cardiovascular:     Rate and Rhythm: Normal rate and regular rhythm.     Heart sounds: No murmur heard. Pulmonary:     Effort: No respiratory distress.     Breath sounds: No stridor. No rhonchi or rales.     Comments: There are few inspiratory and expiratory wheezes heard anteriorly.  Air movement is good  Musculoskeletal:     Cervical back: Neck supple.  Lymphadenopathy:     Cervical: No cervical adenopathy.  Skin:    Coloration: Skin is not jaundiced or pale.     Comments: There is a mildly erythematous rash in the patch about 8 x 6 cm on her left neck onto the left  shoulder.  It has a little bit of papular component.  It is not  indurated.  There is no fluctuance and it is nontender.    Neurological:     General: No focal deficit present.     Mental Status: She is alert and oriented to person, place, and time.  Psychiatric:        Behavior: Behavior normal.      UC Treatments / Results  Labs (all labs ordered are listed, but only abnormal results are displayed) Labs Reviewed - No data to display  EKG   Radiology No results found.  Procedures Procedures (including critical care time)  Medications Ordered in UC Medications  dexamethasone  (DECADRON ) injection 10 mg (has no administration in time range)    Initial Impression / Assessment and Plan / UC Course  I have reviewed the triage vital signs and the nursing notes.  Pertinent labs & imaging results that were available during my care of the patient were reviewed by me and considered in my medical decision making (see chart for details).     Vital signs are reassuring.  She is concerned that she has something serious going on since she had similar symptoms when she was given IV medicines, possibly due to an severe allergic reaction or infection?  Today I do not think she has anything severe at this time.  I am treating her with Decadron  and prednisone  for an asthma exacerbation and for allergic dermatitis and insect bite reaction.  Albuterol  is also sent in.   Final Clinical Impressions(s) / UC Diagnoses   Final diagnoses:  Allergic dermatitis  Insect bite of neck, initial encounter  Mild intermittent asthma with (acute) exacerbation     Discharge Instructions      Your vital signs were all normal.  You have been given a shot of dexamethasone  10 mg, steroid.   Albuterol  inhaler--do 2 puffs every 4 hours as needed for shortness of breath or wheezing  Take prednisone  20 mg--2 daily for 5 days  Take cetirizine/Zyrtec 10 mg--over-the-counter--1 tablet daily as needed  for itching.    ED Prescriptions     Medication Sig Dispense Auth. Provider   albuterol  (VENTOLIN  HFA) 108 (90 Base) MCG/ACT inhaler Inhale 2 puffs into the lungs every 4 (four) hours as needed for wheezing or shortness of breath. 1 each Vonna Sharlet POUR, MD   predniSONE  (DELTASONE ) 20 MG tablet Take 2 tablets (40 mg total) by mouth daily with breakfast for 5 days. 10 tablet Vonna Kajah Santizo K, MD      PDMP not reviewed this encounter.   Vonna Sharlet POUR, MD 03/11/24 [phone]

## 2024-03-11 NOTE — ED Notes (Signed)
 Pt c/o bug bite to lt side of neck a few days ago. States now having redness and itching. States using cortisone cream with no relief. States it's getting worse than better.

## 2024-03-27 ENCOUNTER — Ambulatory Visit (HOSPITAL_COMMUNITY)
Admission: EM | Admit: 2024-03-27 | Discharge: 2024-03-27 | Disposition: A | Discharge status: Home / Self Care | Attending: Internal Medicine | Admitting: Internal Medicine

## 2024-03-27 ENCOUNTER — Encounter (HOSPITAL_COMMUNITY): Payer: Self-pay | Admitting: Emergency Medicine

## 2024-03-27 DIAGNOSIS — J4541 Moderate persistent asthma with (acute) exacerbation: Secondary | ICD-10-CM | POA: Diagnosis not present

## 2024-03-27 DIAGNOSIS — R0602 Shortness of breath: Secondary | ICD-10-CM

## 2024-03-27 MED ORDER — IPRATROPIUM-ALBUTEROL 0.5-2.5 (3) MG/3ML IN SOLN
3.0000 mL | Freq: Once | RESPIRATORY_TRACT | Status: AC
  Administered 2024-03-27: 3 mL via RESPIRATORY_TRACT

## 2024-03-27 MED ORDER — FLUTICASONE-SALMETEROL 45-21 MCG/ACT IN AERO: 2.0000 | INHALATION_SPRAY | Freq: Two times a day (BID) | RESPIRATORY_TRACT | 2 refills | Status: DC

## 2024-03-27 MED ORDER — DEXAMETHASONE SODIUM PHOSPHATE 10 MG/ML IJ SOLN
10.0000 mg | Freq: Once | INTRAMUSCULAR | Status: AC
  Administered 2024-03-27: 10 mg via INTRAMUSCULAR

## 2024-03-27 MED ORDER — ALBUTEROL SULFATE HFA 108 (90 BASE) MCG/ACT IN AERS: 2.0000 | INHALATION_SPRAY | Freq: Four times a day (QID) | RESPIRATORY_TRACT | 0 refills | Status: DC | PRN

## 2024-03-27 MED ORDER — FLUTICASONE-SALMETEROL 45-21 MCG/ACT IN AERO: 2.0000 | INHALATION_SPRAY | Freq: Two times a day (BID) | RESPIRATORY_TRACT | 12 refills | Status: DC

## 2024-03-27 MED ORDER — DEXAMETHASONE SODIUM PHOSPHATE 10 MG/ML IJ SOLN
INTRAMUSCULAR | Status: AC
  Filled 2024-03-27: qty 1

## 2024-03-27 MED ORDER — IPRATROPIUM-ALBUTEROL 0.5-2.5 (3) MG/3ML IN SOLN
RESPIRATORY_TRACT | Status: AC
  Filled 2024-03-27: qty 3

## 2024-03-27 NOTE — ED Provider Notes (Signed)
 [facility]    CSN: [id number] Arrival date & time: 03/27/24  1521      History   Chief Complaint Chief Complaint  Patient presents with   Shortness of Breath    HPI Gabriella Porter is a 63 y.o. female.   Gabriella Porter is a 63 y.o. female presenting for chief complaint of Shortness of Breath and cough that started approximately 6-7 days ago.  Cough is dry, nonproductive, and worse at nighttime.  She was seen at urgent care on March 11, 2024 and treated for mild intermittent asthma exacerbation with prednisone  and albuterol .  She took all of her prednisone  pills and states this helped significantly with cough and shortness of breath but symptoms returned a few days later.  She has been using her albuterol  multiple times throughout the day with relief of shortness of breath and coughing.  She states she still feels a little bit wheezy in the chest.  Denies recent fever, chills, nausea, vomiting, diarrhea, rash, abdominal pain, chest pain, heart palpitations, dizziness, and recent antibiotic or steroid use.  Never smoker, denies drug use. She has a primary care provider in [place]  but she is moving to [place]  and is unable to get in with a primary care provider in   until July 15, 2024.   Shortness of Breath   Past Medical History:  Diagnosis Date   Arthritis    Asthma    Depression    GERD (gastroesophageal reflux disease)    Glaucoma 10/04/2018   History of recurrent UTI (urinary tract infection)    Hypertension     Patient Active Problem List   Diagnosis Date Noted   Hair thinning 02/13/2020   Dysuria 11/10/2019   Vaginal discharge 11/10/2019   Acute bilateral low back pain without sciatica 11/10/2019   UTI (urinary tract infection) 11/11/2018   Left hand pain 07/22/2018   Chronic pain of both knees 07/22/2018   Essential hypertension 03/27/2018   Extrinsic asthma 03/27/2018   Depression 03/27/2018   Chronic low back pain 03/27/2018     Past Surgical History:  Procedure Laterality Date   ABDOMINAL HYSTERECTOMY  2003   CARPAL TUNNEL RELEASE Bilateral     OB History   No obstetric history on file.      Home Medications    Prior to Admission medications   Medication Sig Start Date End Date Taking? Authorizing Provider  albuterol  (VENTOLIN  HFA) 108 (90 Base) MCG/ACT inhaler Inhale 2 puffs into the lungs every 6 (six) hours as needed for wheezing or shortness of breath. 03/27/24  Yes Rafia Shedden, Dorna HERO, FNP  aspirin  EC 81 MG tablet Take 1 tablet (81 mg total) by mouth daily. 03/29/18   Okey Vina GAILS, MD  citalopram  (CELEXA ) 20 MG tablet Take 1 tablet (20 mg total) by mouth daily. 07/05/20   Jason Leita Repine, FNP  fluticasone -salmeterol (ADVAIR HFA) 45-21 MCG/ACT inhaler Inhale 2 puffs into the lungs 2 (two) times daily. 03/27/24   Enedelia Dorna HERO, FNP  latanoprost (XALATAN) 0.005 % ophthalmic solution 1 drop at bedtime. 08/18/19   [provider]  losartan  (COZAAR ) 100 MG tablet TAKE 1 TABLET BY MOUTH EVERY DAY FOR 90 DAYS Oral; Duration: 90 Days 07/28/21   [provider]  Multiple Vitamins-Minerals (ONE DAILY CALCIUM/IRON PO) Take 1 each by mouth daily.    [provider]  Vitamin D3 (VITAMIN D) 25 MCG tablet Take 1,000 Units by mouth daily.    [provider]    Cir[facility] Of Care  History Family History  Problem Relation Age of Onset   Diabetes Mother    Heart disease Mother    Hyperlipidemia Mother    Hypertension Mother    Stroke Mother    Diabetes Father    Arthritis Father    Heart disease Father    Kidney disease Father    Cancer Sister    Diabetes Maternal Grandmother    Heart disease Maternal Grandmother    Hypertension Maternal Grandmother    Diabetes Maternal Grandfather    Heart disease Maternal Grandfather    Hypertension Maternal Grandfather    Diabetes Paternal Grandmother    Heart disease Paternal Grandmother    Hypertension Paternal Grandmother     Diabetes Paternal Grandfather    Heart disease Paternal Grandfather    Hypertension Paternal Grandfather    Hyperlipidemia Sister    Bipolar disorder Sister     Social History Social History   Tobacco Use   Smoking status: Never   Smokeless tobacco: Never  Vaping Use   Vaping status: Never Used  Substance Use Topics   Alcohol use: Not Currently   Drug use: Never     Allergies   Sulfa antibiotics   Review of Systems Review of Systems  Respiratory:  Positive for shortness of breath.   Per HPI   Physical Exam Triage Vital Signs ED Triage Vitals [03/27/24 1535]  Encounter Vitals Group     BP 107/68     Girls Systolic BP Percentile      Girls Diastolic BP Percentile      Boys Systolic BP Percentile      Boys Diastolic BP Percentile      Pulse Rate 73     Resp (!) 22     Temp 98.4 F (36.9 C)     Temp Source Oral     SpO2 100 %     Weight      Height      Head Circumference      Peak Flow      Pain Score 0     Pain Loc      Pain Education      Exclude from Growth Chart    No data found.  Updated Vital Signs BP 107/68 (BP Location: Right Arm)   Pulse 73   Temp 98.4 F (36.9 C) (Oral)   Resp (!) 22   LMP 08/07/2001   SpO2 100%   Visual Acuity Right Eye Distance:   Left Eye Distance:   Bilateral Distance:    Right Eye Near:   Left Eye Near:    Bilateral Near:     Physical Exam Vitals and nursing note reviewed.  Constitutional:      Appearance: She is not ill-appearing or toxic-appearing.  HENT:     Head: Normocephalic and atraumatic.     Right Ear: Hearing and external ear normal.     Left Ear: Hearing and external ear normal.     Nose: Nose normal.     Mouth/Throat:     Lips: Pink.  Eyes:     General: Lids are normal. Vision grossly intact. Gaze aligned appropriately.     Extraocular Movements: Extraocular movements intact.     Conjunctiva/sclera: Conjunctivae normal.  Cardiovascular:     Rate and Rhythm: Normal rate and regular  rhythm.     Heart sounds: Normal heart sounds, S1 normal and S2 normal.  Pulmonary:     Effort: Pulmonary effort is normal. No respiratory distress.  Breath sounds: Normal air entry. Decreased breath sounds (Decreased breath sounds throughout with harsh/dry cough elicited with deep inspiration.) present.     Comments: Speaks in full sentences without difficulty.  No accessory muscle use or signs of respiratory distress. Musculoskeletal:     Cervical back: Neck supple.  Skin:    General: Skin is warm and dry.     Capillary Refill: Capillary refill takes less than 2 seconds.     Findings: No rash.  Neurological:     General: No focal deficit present.     Mental Status: She is alert and oriented to person, place, and time. Mental status is at baseline.     Cranial Nerves: No dysarthria or facial asymmetry.  Psychiatric:        Mood and Affect: Mood normal.        Speech: Speech normal.        Behavior: Behavior normal.        Thought Content: Thought content normal.        Judgment: Judgment normal.      UC Treatments / Results  Labs (all labs ordered are listed, but only abnormal results are displayed) Labs Reviewed - No data to display  EKG   Radiology No results found.  Procedures Procedures (including critical care time)  Medications Ordered in UC Medications  ipratropium-albuterol  (DUONEB) 0.5-2.5 (3) MG/3ML nebulizer solution 3 mL (3 mLs Nebulization Given 03/27/24 1805)  dexamethasone  (DECADRON ) injection 10 mg (10 mg Intramuscular Given 03/27/24 1805)    Initial Impression / Assessment and Plan / UC Course  I have reviewed the triage vital signs and the nursing notes.  Pertinent labs & imaging results that were available during my care of the patient were reviewed by me and considered in my medical decision making (see chart for details).   1.  Moderate persistent asthma with acute exacerbation DuoNeb and dexamethasone  10 mg IM given in clinic with  significant improvement in shortness of breath on reassessment prior to discharge. I would like to step up patient's asthma therapy and have her start Advair 2 puffs every 12 hours for 30 days to see if this helps prevent close asthma exacerbations. She may use albuterol  in between use of Advair as needed for rescue relief.  She recently had prednisone  burst, therefore I would like to avoid giving steroid pills today due to recent use of steroids.  Deferred imaging based on stable cardiopulmonary exam, normal lung sounds after DuoNeb, and low suspicion for pneumonia.  Vital stable, no new oxygen requirement.  Follow-up with primary care provider as scheduled in December.  Counseled patient on potential for adverse effects with medications prescribed/recommended today, strict ER and return-to-clinic precautions discussed, patient verbalized understanding.    Final Clinical Impressions(s) / UC Diagnoses   Final diagnoses:  Moderate persistent asthma with acute exacerbation     Discharge Instructions      You were seen today for moderate persistent asthma exacerbation. We gave you a breathing treatment and a steroid shot in the clinic which helped with your wheezing. The steroid shot will continue to work in your body over the next 2 or 3 days. Continue using albuterol  2 puffs every 4-6 hours as needed for shortness of breath. I would like for you to start Advair inhaler 2 puffs in the morning and 2 puffs at night for the next 30 days to see if this will help reduce frequency of your asthma flares.  Please follow-up with your primary care provider  as scheduled in December.  Return to urgent care sooner if needed.  If you develop any new or worsening symptoms or if your symptoms do not start to improve, please return here or follow-up with your primary care provider. If your symptoms are severe, please go to the emergency room.     ED Prescriptions     Medication Sig Dispense Auth.  Provider   fluticasone -salmeterol (ADVAIR HFA) 45-21 MCG/ACT inhaler  (Status: Discontinued) Inhale 2 puffs into the lungs 2 (two) times daily. 1 each Enedelia Dorna HERO, FNP   fluticasone -salmeterol (ADVAIR HFA) 45-21 MCG/ACT inhaler Inhale 2 puffs into the lungs 2 (two) times daily. 1 each Enedelia Dorna HERO, FNP   albuterol  (VENTOLIN  HFA) 108 (90 Base) MCG/ACT inhaler Inhale 2 puffs into the lungs every 6 (six) hours as needed for wheezing or shortness of breath. 18 g Enedelia Dorna HERO, FNP      PDMP not reviewed this encounter.   Enedelia Dorna HERO, [place] 03/27/24 1901

## 2024-03-27 NOTE — ED Triage Notes (Signed)
 Pt reports [place] from [place] and hasn't got set up with PCP yet. Reports having asthma flare up that started a few days after being seen here for same thing (8/5).

## 2024-03-27 NOTE — Discharge Instructions (Addendum)
 You were seen today for moderate persistent asthma exacerbation. We gave you a breathing treatment and a steroid shot in the clinic which helped with your wheezing. The steroid shot will continue to work in your body over the next 2 or 3 days. Continue using albuterol  2 puffs every 4-6 hours as needed for shortness of breath. I would like for you to start Advair inhaler 2 puffs in the morning and 2 puffs at night for the next 30 days to see if this will help reduce frequency of your asthma flares.  Please follow-up with your primary care provider as scheduled in December.  Return to urgent care sooner if needed.  If you develop any new or worsening symptoms or if your symptoms do not start to improve, please return here or follow-up with your primary care provider. If your symptoms are severe, please go to the emergency room.

## 2024-06-07 ENCOUNTER — Encounter (HOSPITAL_COMMUNITY): Payer: Self-pay | Admitting: Emergency Medicine

## 2024-06-07 ENCOUNTER — Ambulatory Visit (HOSPITAL_COMMUNITY)
Admission: EM | Admit: 2024-06-07 | Discharge: 2024-06-07 | Disposition: A | Discharge status: Home / Self Care | Attending: Internal Medicine | Admitting: Internal Medicine

## 2024-06-07 ENCOUNTER — Other Ambulatory Visit: Payer: Self-pay

## 2024-06-07 ENCOUNTER — Ambulatory Visit (INDEPENDENT_AMBULATORY_CARE_PROVIDER_SITE_OTHER)

## 2024-06-07 DIAGNOSIS — R051 Acute cough: Secondary | ICD-10-CM

## 2024-06-07 DIAGNOSIS — J0110 Acute frontal sinusitis, unspecified: Secondary | ICD-10-CM | POA: Diagnosis not present

## 2024-06-07 DIAGNOSIS — J4521 Mild intermittent asthma with (acute) exacerbation: Secondary | ICD-10-CM | POA: Diagnosis not present

## 2024-06-07 DIAGNOSIS — R0602 Shortness of breath: Secondary | ICD-10-CM | POA: Diagnosis not present

## 2024-06-07 DIAGNOSIS — R059 Cough, unspecified: Secondary | ICD-10-CM | POA: Diagnosis not present

## 2024-06-07 DIAGNOSIS — R509 Fever, unspecified: Secondary | ICD-10-CM | POA: Diagnosis not present

## 2024-06-07 MED ORDER — PREDNISONE 20 MG PO TABS: 40.0000 mg | ORAL_TABLET | Freq: Every day | ORAL | 0 refills | Status: AC

## 2024-06-07 MED ORDER — PROMETHAZINE-DM 6.25-15 MG/5ML PO SYRP: 5.0000 mL | ORAL_SOLUTION | Freq: Three times a day (TID) | ORAL | 0 refills | Status: DC | PRN

## 2024-06-07 MED ORDER — AMOXICILLIN-POT CLAVULANATE 875-125 MG PO TABS: 1.0000 | ORAL_TABLET | Freq: Two times a day (BID) | ORAL | 0 refills | Status: AC

## 2024-06-07 NOTE — Discharge Instructions (Addendum)
 Chest x-ray done today due to persistent cough and mild shortness of breath.  Final evaluation by the radiologist does not show any acute findings. Symptoms and physical exam findings are most consistent with a severe sinus infection.  This has been present for at least a week now.  Given this we will treat with antibiotics by mouth.  There is also likely a mild asthma exacerbation and we will treat with prednisone  to reduce inflammation.  We will treat with the following: Augmentin  875 mg twice daily for 7 days.  This is an antibiotic.  Take this with food. Prednisone  40 mg (2 tablets) once daily for 5 days. Take this in the morning.  This is a steroid to help with inflammation and pain.  Do not take ibuprofen  while you are taking this medication.  It is okay to take Tylenol Promethazine DM 5 mL every 8 hours as needed for cough.  Use caution as this medication can cause drowsiness.  Make sure to stay hydrated by drinking plenty of water. Return to urgent care or PCP if symptoms worsen or fail to resolve.

## 2024-06-07 NOTE — ED Provider Notes (Addendum)
 [facility]    CSN: [id number] Arrival date & time: 06/07/24  1006      History   Chief Complaint No chief complaint on file.   HPI Gabriella Porter is a 63 y.o. female.   63 year old female presents urgent care with complaints of cough, sinus congestion, headaches, dizziness, facial pain, low-grade fever and mild shortness of breath.  Her symptoms started on October 26.  She has been trying to treat it with over-the-counter medication but is not having any relief.  Her symptoms have actually worsened.  Her biggest complaint is the severe congestion and dizziness.  She denies any slurred speech, vision loss, loss of consciousness.  She relates the drainage from her nose has become very thick and green.  She does have a history of asthma and she has had some increase in shortness of breath along with this.     Past Medical History:  Diagnosis Date   Arthritis    Asthma    Depression    GERD (gastroesophageal reflux disease)    Glaucoma 10/04/2018   History of recurrent UTI (urinary tract infection)    Hypertension     Patient Active Problem List   Diagnosis Date Noted   Hair thinning 02/13/2020   Dysuria 11/10/2019   Vaginal discharge 11/10/2019   Acute bilateral low back pain without sciatica 11/10/2019   UTI (urinary tract infection) 11/11/2018   Left hand pain 07/22/2018   Chronic pain of both knees 07/22/2018   Essential hypertension 03/27/2018   Extrinsic asthma 03/27/2018   Depression 03/27/2018   Chronic low back pain 03/27/2018    Past Surgical History:  Procedure Laterality Date   ABDOMINAL HYSTERECTOMY  2003   CARPAL TUNNEL RELEASE Bilateral     OB History   No obstetric history on file.      Home Medications    Prior to Admission medications   Medication Sig Start Date End Date Taking? Authorizing Provider  amoxicillin -clavulanate (AUGMENTIN ) 875-125 MG tablet Take 1 tablet by mouth every 12 (twelve) hours for 7 days. 06/07/24 06/14/24  Yes Oluwatobiloba Martin A, PA-C  predniSONE  (DELTASONE ) 20 MG tablet Take 2 tablets (40 mg total) by mouth daily with breakfast for 5 days. 06/07/24 06/12/24 Yes Kaydyn Chism A, PA-C  promethazine-dextromethorphan (PROMETHAZINE-DM) 6.25-15 MG/5ML syrup Take 5 mLs by mouth every 8 (eight) hours as needed for cough. 06/07/24  Yes Jhase Creppel A, PA-C  albuterol  (VENTOLIN  HFA) 108 (90 Base) MCG/ACT inhaler Inhale 2 puffs into the lungs every 6 (six) hours as needed for wheezing or shortness of breath. 03/27/24   Enedelia Dorna HERO, FNP  aspirin  EC 81 MG tablet Take 1 tablet (81 mg total) by mouth daily. 03/29/18   Okey Vina GAILS, MD  citalopram  (CELEXA ) 20 MG tablet Take 1 tablet (20 mg total) by mouth daily. 07/05/20   Jason Leita Repine, FNP  fluticasone -salmeterol (ADVAIR HFA) 45-21 MCG/ACT inhaler Inhale 2 puffs into the lungs 2 (two) times daily. 03/27/24   Enedelia Dorna HERO, FNP  latanoprost (XALATAN) 0.005 % ophthalmic solution 1 drop at bedtime. 08/18/19   [provider]  losartan  (COZAAR ) 100 MG tablet TAKE 1 TABLET BY MOUTH EVERY DAY FOR 90 DAYS Oral; Duration: 90 Days 07/28/21   [provider]  Multiple Vitamins-Minerals (ONE DAILY CALCIUM/IRON PO) Take 1 each by mouth daily.    [provider]  Vitamin D3 (VITAMIN D) 25 MCG tablet Take 1,000 Units by mouth daily.    [provider]  Family History Family History  Problem Relation Age of Onset   Diabetes Mother    Heart disease Mother    Hyperlipidemia Mother    Hypertension Mother    Stroke Mother    Diabetes Father    Arthritis Father    Heart disease Father    Kidney disease Father    Cancer Sister    Hyperlipidemia Sister    Bipolar disorder Sister    Diabetes Maternal Grandmother    Heart disease Maternal Grandmother    Hypertension Maternal Grandmother    Diabetes Maternal Grandfather    Heart disease Maternal Grandfather    Hypertension Maternal Grandfather    Diabetes  Paternal Grandmother    Heart disease Paternal Grandmother    Hypertension Paternal Grandmother    Diabetes Paternal Grandfather    Heart disease Paternal Grandfather    Hypertension Paternal Grandfather     Social History Social History   Tobacco Use   Smoking status: Never   Smokeless tobacco: Never  Vaping Use   Vaping status: Never Used  Substance Use Topics   Alcohol use: Not Currently   Drug use: Never     Allergies   Sulfa antibiotics   Review of Systems Review of Systems  Constitutional:  Positive for fatigue and fever. Negative for chills.  HENT:  Positive for congestion, facial swelling, nosebleeds and postnasal drip. Negative for ear pain and sore throat.   Eyes:  Negative for pain and visual disturbance.  Respiratory:  Positive for cough and shortness of breath.   Cardiovascular:  Negative for chest pain and palpitations.  Gastrointestinal:  Negative for abdominal pain and vomiting.  Genitourinary:  Negative for dysuria and hematuria.  Musculoskeletal:  Negative for arthralgias and back pain.  Skin:  Negative for color change and rash.  Neurological:  Positive for dizziness and headaches. Negative for seizures and syncope.  All other systems reviewed and are negative.    Physical Exam Triage Vital Signs ED Triage Vitals  Encounter Vitals Group     BP 06/07/24 1115 116/74     Girls Systolic BP Percentile --      Girls Diastolic BP Percentile --      Boys Systolic BP Percentile --      Boys Diastolic BP Percentile --      Pulse Rate 06/07/24 1115 83     Resp 06/07/24 1115 18     Temp 06/07/24 1115 98.2 F (36.8 C)     Temp Source 06/07/24 1115 Oral     SpO2 06/07/24 1115 97 %     Weight --      Height --      Head Circumference --      Peak Flow --      Pain Score 06/07/24 1112 5     Pain Loc --      Pain Education --      Exclude from Growth Chart --    No data found.  Updated Vital Signs BP 116/74 (BP Location: Left Arm) Comment (BP  Location): large cuff  Pulse 83   Temp 98.2 F (36.8 C) (Oral)   Resp 18   LMP 08/07/2001   SpO2 97%   Visual Acuity Right Eye Distance:   Left Eye Distance:   Bilateral Distance:    Right Eye Near:   Left Eye Near:    Bilateral Near:     Physical Exam Vitals and nursing note reviewed.  Constitutional:      General: She is  not in acute distress.    Appearance: She is well-developed.  HENT:     Head: Normocephalic and atraumatic.     Nose: Nasal tenderness, mucosal edema, congestion and rhinorrhea present. Rhinorrhea is purulent.     Left Nostril: Epistaxis present.     Right Sinus: Maxillary sinus tenderness and frontal sinus tenderness present.     Left Sinus: Maxillary sinus tenderness and frontal sinus tenderness present.  Eyes:     Extraocular Movements: Extraocular movements intact.     Conjunctiva/sclera: Conjunctivae normal.     Pupils: Pupils are equal, round, and reactive to light.  Cardiovascular:     Rate and Rhythm: Normal rate and regular rhythm.     Heart sounds: No murmur heard. Pulmonary:     Effort: Pulmonary effort is normal. No tachypnea or respiratory distress.     Breath sounds: Examination of the right-upper field reveals wheezing. Examination of the left-upper field reveals wheezing. Examination of the right-lower field reveals decreased breath sounds. Examination of the left-lower field reveals decreased breath sounds. Decreased breath sounds and wheezing present.  Abdominal:     Palpations: Abdomen is soft.     Tenderness: There is no abdominal tenderness.  Musculoskeletal:        General: No swelling.     Cervical back: Neck supple.  Skin:    General: Skin is warm and dry.     Capillary Refill: Capillary refill takes less than 2 seconds.  Neurological:     Mental Status: She is alert.     Cranial Nerves: Cranial nerves 2-12 are intact.     Coordination: Coordination is intact.     Gait: Gait is intact.  Psychiatric:        Mood and  Affect: Mood normal.      UC Treatments / Results  Labs (all labs ordered are listed, but only abnormal results are displayed) Labs Reviewed - No data to display  EKG   Radiology DG Chest 2 View Result Date: 06/07/2024 EXAM: 2 VIEW(S) XRAY OF THE CHEST 06/07/2024 11:48:28 AM COMPARISON: None available. CLINICAL HISTORY: Cough, shortness of breath, fever. FINDINGS: LUNGS AND PLEURA: No focal pulmonary opacity. No pulmonary edema. No pleural effusion. No pneumothorax. HEART AND MEDIASTINUM: No acute abnormality of the cardiac and mediastinal silhouettes. BONES AND SOFT TISSUES: No acute osseous abnormality. IMPRESSION: 1. No acute process. Electronically signed by: Lynwood Seip MD 06/07/2024 11:53 AM EDT RP Workstation: [id number]    Procedures Procedures (including critical care time)  Medications Ordered in UC Medications - No data to display  Initial Impression / Assessment and Plan / UC Course  I have reviewed the triage vital signs and the nursing notes.  Pertinent labs & imaging results that were available during my care of the patient were reviewed by me and considered in my medical decision making (see chart for details).     Acute non-recurrent frontal sinusitis  Acute cough - Plan: DG Chest 2 View, DG Chest 2 View  Mild intermittent asthma with acute exacerbation   Chest x-ray done today due to persistent cough and mild shortness of breath.  Final evaluation by the radiologist does not show any acute findings. Symptoms and physical exam findings are most consistent with a severe sinus infection.  This has been present for at least a week now.  Given this we will treat with antibiotics by mouth.  There is also likely a mild asthma exacerbation and we will treat with prednisone  to reduce inflammation.  We will  treat with the following: Augmentin  875 mg twice daily for 7 days.  This is an antibiotic.  Take this with food. Prednisone  40 mg (2 tablets) once daily for 5 days.  Take this in the morning.  This is a steroid to help with inflammation and pain.  Do not take ibuprofen  while you are taking this medication.  It is okay to take Tylenol Promethazine DM 5 mL every 8 hours as needed for cough.  Use caution as this medication can cause drowsiness.  Make sure to stay hydrated by drinking plenty of water. Return to urgent care or PCP if symptoms worsen or fail to resolve.    Final Clinical Impressions(s) / UC Diagnoses   Final diagnoses:  Acute cough  Acute non-recurrent frontal sinusitis  Mild intermittent asthma with acute exacerbation     Discharge Instructions      Chest x-ray done today due to persistent cough and mild shortness of breath.  Final evaluation by the radiologist does not show any acute findings. Symptoms and physical exam findings are most consistent with a severe sinus infection.  This has been present for at least a week now.  Given this we will treat with antibiotics by mouth.  There is also likely a mild asthma exacerbation and we will treat with prednisone  to reduce inflammation.  We will treat with the following: Augmentin  875 mg twice daily for 7 days.  This is an antibiotic.  Take this with food. Prednisone  40 mg (2 tablets) once daily for 5 days. Take this in the morning.  This is a steroid to help with inflammation and pain.  Do not take ibuprofen  while you are taking this medication.  It is okay to take Tylenol Promethazine DM 5 mL every 8 hours as needed for cough.  Use caution as this medication can cause drowsiness.  Make sure to stay hydrated by drinking plenty of water. Return to urgent care or PCP if symptoms worsen or fail to resolve.       ED Prescriptions     Medication Sig Dispense Auth. Provider   amoxicillin -clavulanate (AUGMENTIN ) 875-125 MG tablet Take 1 tablet by mouth every 12 (twelve) hours for 7 days. 14 tablet Broghan Pannone A, PA-C   predniSONE  (DELTASONE ) 20 MG tablet Take 2 tablets (40 mg total) by  mouth daily with breakfast for 5 days. 10 tablet Teresa Almarie LABOR, PA-C   promethazine-dextromethorphan (PROMETHAZINE-DM) 6.25-15 MG/5ML syrup Take 5 mLs by mouth every 8 (eight) hours as needed for cough. 180 mL Teresa Almarie LABOR, [place]      PDMP not reviewed this encounter.   Teresa Almarie LABOR, PA-C 06/07/24 1201    Teresa Almarie LABOR, PA-C 06/07/24 1202

## 2024-06-07 NOTE — ED Triage Notes (Signed)
 Onset Sunday 06/01/2024 night.  Symptoms include , sore throat, ears aching, increase in head congestion, stuffy nose.   Reports on Sunday having 100.3 fever Has used [facility] cold [facility], acetaminophen   Reports mucus is getting thicker, reported as brownish, thicker, green-yellow.  Also has had a nose bleed

## 2024-06-21 ENCOUNTER — Encounter (HOSPITAL_COMMUNITY): Payer: Self-pay

## 2024-06-21 ENCOUNTER — Ambulatory Visit (HOSPITAL_COMMUNITY)
Admission: EM | Admit: 2024-06-21 | Discharge: 2024-06-21 | Disposition: A | Discharge status: Home / Self Care | Attending: Family Medicine | Admitting: Family Medicine

## 2024-06-21 DIAGNOSIS — J4540 Moderate persistent asthma, uncomplicated: Secondary | ICD-10-CM | POA: Diagnosis not present

## 2024-06-21 DIAGNOSIS — J019 Acute sinusitis, unspecified: Secondary | ICD-10-CM | POA: Diagnosis not present

## 2024-06-21 MED ORDER — DOXYCYCLINE HYCLATE 100 MG PO CAPS: 100.0000 mg | ORAL_CAPSULE | Freq: Two times a day (BID) | ORAL | 0 refills | Status: DC

## 2024-06-21 MED ORDER — DOXYCYCLINE HYCLATE 100 MG PO CAPS: 100.0000 mg | ORAL_CAPSULE | Freq: Two times a day (BID) | ORAL | 0 refills | Status: AC

## 2024-06-21 MED ORDER — FLUTICASONE PROPIONATE 50 MCG/ACT NA SUSP: 2.0000 | Freq: Every day | NASAL | 0 refills | Status: AC

## 2024-06-21 MED ORDER — ALBUTEROL SULFATE HFA 108 (90 BASE) MCG/ACT IN AERS: 2.0000 | INHALATION_SPRAY | Freq: Four times a day (QID) | RESPIRATORY_TRACT | 0 refills | Status: DC | PRN

## 2024-06-21 MED ORDER — FLUTICASONE-SALMETEROL 45-21 MCG/ACT IN AERO: 2.0000 | INHALATION_SPRAY | Freq: Two times a day (BID) | RESPIRATORY_TRACT | 0 refills | Status: AC

## 2024-06-21 NOTE — Discharge Instructions (Addendum)
 Take doxycycline 100 mg --1 capsule 2 times daily for 10 days; separate taking this from taking your vitamin by about 2 hours.  Fluticasone /Flonase nose spray--put 2 sprays in each nostril once daily; this medication could help shrink the swelling around her sinus openings.  It can take a few days to start helping.  It is also good to help allergies.  Albuterol  inhaler--do 2 puffs every 4 hours as needed for shortness of breath or wheezing  Use your Advair inhaler 2 puffs 2 times daily routinely, and not as needed.

## 2024-06-21 NOTE — ED Triage Notes (Signed)
 Patient reports fatigue and coughing up colorful mucous.  She states she finish the abx but is not any better.

## 2024-06-21 NOTE — ED Provider Notes (Signed)
 [facility]    CSN: [id number] Arrival date & time: 06/21/24  0854      History   Chief Complaint Chief Complaint  Patient presents with   Cough    HPI Gabriella Porter is a 63 y.o. female.    Cough Here for worsening cough and nasal congestion.  She was seen here on November 1 at which time she was diagnosed with sinusitis and asthma exacerbation.  Prescriptions were done for Augmentin  and prednisone .  She did improve while she was taking those medications.  Then in the 2 to 3 days after she was off those medications she began having increased postnasal drainage and is coughing up colored thick sputum.  She did continue to have some clear rhinorrhea while she was on the antibiotics, but that has thickened up also. She states the mucus has a terrible taste.  She has been using her Advair as needed instead of twice daily  She does have an albuterol  inhaler at home also but she wonders if she needs a new prescription  No fever  She is allergic to sulfa  Past Medical History:  Diagnosis Date   Arthritis    Asthma    Depression    GERD (gastroesophageal reflux disease)    Glaucoma 10/04/2018   History of recurrent UTI (urinary tract infection)    Hypertension     Patient Active Problem List   Diagnosis Date Noted   Hair thinning 02/13/2020   Dysuria 11/10/2019   Vaginal discharge 11/10/2019   Acute bilateral low back pain without sciatica 11/10/2019   UTI (urinary tract infection) 11/11/2018   Left hand pain 07/22/2018   Chronic pain of both knees 07/22/2018   Essential hypertension 03/27/2018   Extrinsic asthma 03/27/2018   Depression 03/27/2018   Chronic low back pain 03/27/2018    Past Surgical History:  Procedure Laterality Date   ABDOMINAL HYSTERECTOMY  2003   CARPAL TUNNEL RELEASE Bilateral     OB History   No obstetric history on file.      Home Medications    Prior to Admission medications   Medication Sig Start Date End Date  Taking? Authorizing Provider  aspirin  EC 81 MG tablet Take 1 tablet (81 mg total) by mouth daily. 03/29/18  Yes Okey Vina GAILS, MD  citalopram  (CELEXA ) 20 MG tablet Take 1 tablet (20 mg total) by mouth daily. 07/05/20  Yes Jason Leita Repine, FNP  fluticasone  (FLONASE) 50 MCG/ACT nasal spray Place 2 sprays into both nostrils daily. 06/21/24  Yes Faith Branan K, MD  latanoprost (XALATAN) 0.005 % ophthalmic solution 1 drop at bedtime. 08/18/19  Yes [provider]  losartan  (COZAAR ) 100 MG tablet TAKE 1 TABLET BY MOUTH EVERY DAY FOR 90 DAYS Oral; Duration: 90 Days 07/28/21  Yes [provider]  Multiple Vitamins-Minerals (ONE DAILY CALCIUM/IRON PO) Take 1 each by mouth daily.   Yes [provider]  promethazine-dextromethorphan (PROMETHAZINE-DM) 6.25-15 MG/5ML syrup Take 5 mLs by mouth every 8 (eight) hours as needed for cough. 06/07/24  Yes White, Elizabeth A, PA-C  Vitamin D3 (VITAMIN D) 25 MCG tablet Take 1,000 Units by mouth daily.   Yes [provider]  albuterol  (VENTOLIN  HFA) 108 (90 Base) MCG/ACT inhaler Inhale 2 puffs into the lungs every 6 (six) hours as needed for wheezing or shortness of breath. 06/21/24   Tabetha Haraway K, MD  doxycycline (VIBRAMYCIN) 100 MG capsule Take 1 capsule (100 mg total) by mouth 2 (two) times daily for 10  days. 06/21/24 07/01/24  Vonna Sharlet POUR, MD  fluticasone -salmeterol (ADVAIR HFA) 45-21 MCG/ACT inhaler Inhale 2 puffs into the lungs 2 (two) times daily. 06/21/24   Vonna Sharlet POUR, MD    Family History Family History  Problem Relation Age of Onset   Diabetes Mother    Heart disease Mother    Hyperlipidemia Mother    Hypertension Mother    Stroke Mother    Diabetes Father    Arthritis Father    Heart disease Father    Kidney disease Father    Cancer Sister    Hyperlipidemia Sister    Bipolar disorder Sister    Diabetes Maternal Grandmother    Heart disease Maternal Grandmother    Hypertension  Maternal Grandmother    Diabetes Maternal Grandfather    Heart disease Maternal Grandfather    Hypertension Maternal Grandfather    Diabetes Paternal Grandmother    Heart disease Paternal Grandmother    Hypertension Paternal Grandmother    Diabetes Paternal Grandfather    Heart disease Paternal Grandfather    Hypertension Paternal Grandfather     Social History Social History   Tobacco Use   Smoking status: Never   Smokeless tobacco: Never  Vaping Use   Vaping status: Never Used  Substance Use Topics   Alcohol use: Not Currently   Drug use: Never     Allergies   Sulfa antibiotics   Review of Systems Review of Systems  Respiratory:  Positive for cough.      Physical Exam Triage Vital Signs ED Triage Vitals [06/21/24 0943]  Encounter Vitals Group     BP 117/75     Girls Systolic BP Percentile      Girls Diastolic BP Percentile      Boys Systolic BP Percentile      Boys Diastolic BP Percentile      Pulse Rate 76     Resp 18     Temp 98.2 F (36.8 C)     Temp Source Oral     SpO2 93 %     Weight      Height      Head Circumference      Peak Flow      Pain Score      Pain Loc      Pain Education      Exclude from Growth Chart    No data found.  Updated Vital Signs BP 117/75 (BP Location: Left Arm)   Pulse 76   Temp 98.2 F (36.8 C) (Oral)   Resp 18   LMP 08/07/2001   SpO2 93%   Visual Acuity Right Eye Distance:   Left Eye Distance:   Bilateral Distance:    Right Eye Near:   Left Eye Near:    Bilateral Near:     Physical Exam Vitals reviewed.  Constitutional:      General: She is not in acute distress.    Appearance: She is not ill-appearing, toxic-appearing or diaphoretic.  HENT:     Right Ear: Tympanic membrane and ear canal normal.     Left Ear: Tympanic membrane and ear canal normal.     Ears:     Comments: Both tympanic membranes are gray and shiny    Nose: Nose normal.     Mouth/Throat:     Mouth: Mucous membranes are moist.      Comments: There is white mucus draining Eyes:     Extraocular Movements: Extraocular movements intact.     Conjunctiva/sclera:  Conjunctivae normal.     Pupils: Pupils are equal, round, and reactive to light.  Cardiovascular:     Rate and Rhythm: Normal rate and regular rhythm.     Heart sounds: No murmur heard. Pulmonary:     Effort: No respiratory distress.     Breath sounds: No stridor. No wheezing, rhonchi or rales.  Musculoskeletal:     Cervical back: Neck supple.  Lymphadenopathy:     Cervical: No cervical adenopathy.  Skin:    Capillary Refill: Capillary refill takes less than 2 seconds.     Coloration: Skin is not jaundiced or pale.  Neurological:     General: No focal deficit present.     Mental Status: She is alert and oriented to person, place, and time.  Psychiatric:        Behavior: Behavior normal.      UC Treatments / Results  Labs (all labs ordered are listed, but only abnormal results are displayed) Labs Reviewed - No data to display  EKG   Radiology No results found.  Procedures Procedures (including critical care time)  Medications Ordered in UC Medications - No data to display  Initial Impression / Assessment and Plan / UC Course  I have reviewed the triage vital signs and the nursing notes.  Pertinent labs & imaging results that were available during my care of the patient were reviewed by me and considered in my medical decision making (see chart for details).      While some of her symptoms could be allergic in nature, the way she describes her mucus and the pattern of her symptoms makes me concerned that she is still having sinusitis.  Doxycycline is sent in for this and Flonase is sent and to try to help sinusitis and possible allergic rhinitis.  I have asked her to take her Advair twice a day routinely and to use albuterol  as needed.   Final Clinical Impressions(s) / UC Diagnoses   Final diagnoses:  Acute sinusitis,  recurrence not specified, unspecified location  Moderate persistent asthma, uncomplicated     Discharge Instructions      Take doxycycline 100 mg --1 capsule 2 times daily for 10 days; separate taking this from taking your vitamin by about 2 hours.  Fluticasone /Flonase nose spray--put 2 sprays in each nostril once daily; this medication could help shrink the swelling around her sinus openings.  It can take a few days to start helping.  It is also good to help allergies.  Albuterol  inhaler--do 2 puffs every 4 hours as needed for shortness of breath or wheezing  Use your Advair inhaler 2 puffs 2 times daily routinely, and not as needed.     ED Prescriptions     Medication Sig Dispense Auth. Provider   albuterol  (VENTOLIN  HFA) 108 (90 Base) MCG/ACT inhaler Inhale 2 puffs into the lungs every 6 (six) hours as needed for wheezing or shortness of breath. 18 g Vonna Sharlet POUR, MD   fluticasone -salmeterol (ADVAIR HFA) 45-21 MCG/ACT inhaler Inhale 2 puffs into the lungs 2 (two) times daily. 1 each Jazir Newey K, MD   doxycycline (VIBRAMYCIN) 100 MG capsule  (Status: Discontinued) Take 1 capsule (100 mg total) by mouth 2 (two) times daily for 7 days. 14 capsule Mani Celestin K, MD   fluticasone  [facility]) 50 MCG/ACT nasal spray Place 2 sprays into both nostrils daily. 16 g Juhi Lagrange K, MD   doxycycline (VIBRAMYCIN) 100 MG capsule Take 1 capsule (100 mg total) by mouth 2 (two)  times daily for 10 days. 20 capsule Ernestina Joe K, MD      PDMP not reviewed this encounter.   Vonna Sharlet POUR, MD 06/21/24 1019

## 2024-07-15 ENCOUNTER — Encounter: Payer: Self-pay | Admitting: Family Medicine

## 2024-07-15 ENCOUNTER — Ambulatory Visit: Admitting: Family Medicine

## 2024-07-15 VITALS — BP 110/80 | HR 78 | Temp 98.3°F | Ht 59.0 in | Wt 190.6 lb

## 2024-07-15 DIAGNOSIS — J4540 Moderate persistent asthma, uncomplicated: Secondary | ICD-10-CM | POA: Diagnosis not present

## 2024-07-15 DIAGNOSIS — N952 Postmenopausal atrophic vaginitis: Secondary | ICD-10-CM | POA: Insufficient documentation

## 2024-07-15 DIAGNOSIS — I10 Essential (primary) hypertension: Secondary | ICD-10-CM

## 2024-07-15 DIAGNOSIS — H6593 Unspecified nonsuppurative otitis media, bilateral: Secondary | ICD-10-CM

## 2024-07-15 DIAGNOSIS — G5602 Carpal tunnel syndrome, left upper limb: Secondary | ICD-10-CM | POA: Diagnosis not present

## 2024-07-15 DIAGNOSIS — G8929 Other chronic pain: Secondary | ICD-10-CM | POA: Diagnosis not present

## 2024-07-15 DIAGNOSIS — G4733 Obstructive sleep apnea (adult) (pediatric): Secondary | ICD-10-CM

## 2024-07-15 MED ORDER — AIRSUPRA 90-80 MCG/ACT IN AERO: 2.0000 | INHALATION_SPRAY | RESPIRATORY_TRACT | 3 refills | Status: AC | PRN

## 2024-07-15 MED ORDER — CEFTRIAXONE SODIUM 1 G IJ SOLR
1.0000 g | Freq: Once | INTRAMUSCULAR | Status: AC
  Administered 2024-07-15: 1 g via INTRAMUSCULAR

## 2024-07-15 NOTE — Progress Notes (Signed)
 New Patient Visit  Subjective:     Patient ID: Gabriella Porter, female    DOB: Apr 18, 1961, 63 y.o.   MRN: [id number]  Chief Complaint  Patient presents with   Establish Care    On medical leave for a combination of things has been monitoring BP and reports its been better c/o pain in LT knee at night ongoing for as well as back pain ongoing issue     HPI  Discussed the use of AI scribe software for clinical note transcription with the patient, who gave verbal consent to proceed.  History of Present Illness Gabriella Porter is a 63 year old female with asthma who presents with worsening asthma symptoms and recurrent bladder infections.  Respiratory symptoms - Worsening asthma over the past two months with increased shortness of breath and cough. - Albuterol  provides symptomatic relief but causes shaking. - Inconsistent use of maintenance inhaler. - Recent ear and sinus infection treated with antibiotics without full resolution. - Chest x-ray and urgent care evaluation ruled out pneumonia.  Recurrent urinary tract infections - Recurrent bladder infections following total hysterectomy. - Prescribed daily vaginal cream by specialist for infection prevention, but use has been inconsistent due to medication backorder.  Musculoskeletal pain - Arthritis with significant back and joint pain. - Difficulty walking due to pain. - Discontinued prior injection therapy because of cost. - Currently manages pain with physical therapy and stretching.  Carpal tunnel syndrome - History of bilateral carpal tunnel surgery. - Right hand is asymptomatic. - Left hand has nighttime pain that improves with use of a brace.  Cognitive concerns - Family history of Alzheimer's disease. - Concerned about worsening memory, which she feels is exacerbated by stress.  Psychosocial stressors - Significant work-related stress perceived to worsen asthma and bladder symptoms. 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     ROS Per HPI  Outpatient Encounter Medications as of 07/15/2024  Medication Sig   Albuterol -Budesonide (AIRSUPRA ) 90-80 MCG/ACT AERO Inhale 2 puffs into the lungs every 4 (four) hours as needed.   aspirin  EC 81 MG tablet Take 1 tablet (81 mg total) by mouth daily.   citalopram  (CELEXA ) 20 MG tablet Take 1 tablet (20 mg total) by mouth daily.   fluticasone  (FLONASE ) 50 MCG/ACT nasal spray Place 2 sprays into both nostrils daily. (Patient taking differently: Place 2 sprays into both nostrils daily. PRN)   fluticasone -salmeterol (ADVAIR HFA) 45-21 MCG/ACT inhaler Inhale 2 puffs into the lungs 2 (two) times daily.   latanoprost (XALATAN) 0.005 % ophthalmic solution 1 drop at bedtime.   latanoprost (XALATAN) 0.005 % ophthalmic solution Apply 1 drop to eye.   losartan  (COZAAR ) 100 MG tablet TAKE 1 TABLET BY MOUTH EVERY DAY FOR 90 DAYS Oral; Duration: 90 Days   Magnesium 400 MG CAPS Take by mouth.   [DISCONTINUED] albuterol  (VENTOLIN  HFA) 108 (90 Base) MCG/ACT inhaler Inhale 2 puffs into the lungs every 6 (six) hours as needed for wheezing or shortness of breath.   Multiple Vitamins-Minerals (ONE DAILY CALCIUM/IRON PO) Take 1 each by mouth daily. (Patient not taking: Reported on 07/15/2024)   promethazine -dextromethorphan (PROMETHAZINE -DM) 6.25-15 MG/5ML syrup Take 5 mLs by mouth every 8 (eight) hours as needed for cough. (Patient not taking: Reported on 07/15/2024)   Vitamin D3 (VITAMIN D) 25 MCG tablet Take 1,000 Units by mouth daily. (Patient not taking: Reported on 07/15/2024)   [EXPIRED] cefTRIAXone  (ROCEPHIN ) injection 1 g    No facility-administered encounter medications on file as of 07/15/2024.  Past Medical History:  Diagnosis Date   Arthritis    Asthma    Depression    GERD (gastroesophageal reflux disease)    Glaucoma 10/04/2018   History of recurrent UTI (urinary tract infection)    Hypertension     Past Surgical History:  Procedure Laterality Date   ABDOMINAL  HYSTERECTOMY  2003   CARPAL TUNNEL RELEASE Bilateral     Family History  Problem Relation Age of Onset   Diabetes Mother    Heart disease Mother    Hyperlipidemia Mother    Hypertension Mother    Stroke Mother    Diabetes Father    Arthritis Father    Heart disease Father    Kidney disease Father    Cancer Sister    Hyperlipidemia Sister    Bipolar disorder Sister    Diabetes Maternal Grandmother    Heart disease Maternal Grandmother    Hypertension Maternal Grandmother    Diabetes Maternal Grandfather    Heart disease Maternal Grandfather    Hypertension Maternal Grandfather    Diabetes Paternal Grandmother    Heart disease Paternal Grandmother    Hypertension Paternal Grandmother    Diabetes Paternal Grandfather    Heart disease Paternal Grandfather    Hypertension Paternal Grandfather     Social History   Socioeconomic History   Marital status: Single    Spouse name: Not on file   Number of children: Not on file   Years of education: Not on file   Highest education level: Not on file  Occupational History   Not on file  Tobacco Use   Smoking status: Never   Smokeless tobacco: Never  Vaping Use   Vaping status: Never Used  Substance and Sexual Activity   Alcohol use: Not Currently   Drug use: Never   Sexual activity: Not on file  Other Topics Concern   Not on file  Social History Narrative   Works as company secretary   Social Drivers of Health   Tobacco Use: Low Risk (07/15/2024)   Patient History    Smoking Tobacco Use: Never    Smokeless Tobacco Use: Never    Passive Exposure: Not on file  Financial Resource Strain: Not on file  Food Insecurity: Not on file  Transportation Needs: Not on file  Physical Activity: Not on file  Stress: Not on file  Social Connections: Not on file  Intimate Partner Violence: Not on file  Depression (PHQ2-9): Low Risk (07/15/2024)   Depression (PHQ2-9)    PHQ-2 Score: 1  Alcohol Screen: Not on file  Housing:  Not on file  Utilities: Not on file  Health Literacy: Not on file       Objective:    BP 110/80 (BP Location: Left Arm, Patient Position: Sitting)   Pulse 78   Temp 98.3 F (36.8 C) (Temporal)   Ht 4' 11 (1.499 m)   Wt 190 lb 9.6 oz (86.5 kg)   LMP 08/07/2001   SpO2 96%   BMI 38.50 kg/m    Physical Exam Vitals and nursing note reviewed.  Constitutional:      General: She is not in acute distress.    Appearance: Normal appearance. She is obese.  HENT:     Head: Normocephalic and atraumatic.     Right Ear: External ear normal.     Left Ear: External ear normal.     Nose: Nose normal.     Mouth/Throat:     Mouth: Mucous membranes are moist.  Pharynx: Oropharynx is clear.  Eyes:     Extraocular Movements: Extraocular movements intact.     Pupils: Pupils are equal, round, and reactive to light.  Cardiovascular:     Rate and Rhythm: Normal rate and regular rhythm.     Pulses: Normal pulses.     Heart sounds: Normal heart sounds.  Pulmonary:     Effort: Pulmonary effort is normal. No respiratory distress.     Breath sounds: Normal breath sounds. No wheezing, rhonchi or rales.  Musculoskeletal:        General: Normal range of motion.     Cervical back: Normal range of motion.     Right lower leg: No edema.     Left lower leg: No edema.  Lymphadenopathy:     Cervical: No cervical adenopathy.  Neurological:     General: No focal deficit present.     Mental Status: She is alert and oriented to person, place, and time.  Psychiatric:        Mood and Affect: Mood normal.        Thought Content: Thought content normal.     No results found for any visits on 07/15/24.      Assessment & Plan:   Assessment and Plan Assessment & Plan Establish Care - UTD on routine screenings, will get these results into her chart - Declines the need for refills today - Reports chronic pain to L wrist and low back  Chronic asthma, moderate persistent Moderate persistent  asthma with recent exacerbations. Inconsistent adherence to current treatment. Stress and work-related issues may contribute. - Prescribed combination inhaler with albuterol  and steroid for as-needed use. - Provided coupon for inhaler to reduce cost. - Educated on importance of daily maintenance inhaler use.  Bilateral otitis media with effusion Persistent ear infection despite previous antibiotics. Eardrums remain inflamed. - Prescribed stronger antibiotic. - Provided refills for current medications.  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 Previous injections effective but cost-prohibitive. Stress and work-related issues may exacerbate symptoms. - Discussed potential for alternative treatments or referrals if pain persists.  Carpal tunnel syndrome, left hand, post-surgical Post-surgical carpal tunnel syndrome with persistent pain, especially at night. Symptoms may be exacerbated by stress and work-related activities. - Advised sleeping in a brace to alleviate symptoms. - Will consider referral for further evaluation if symptoms do not improve.       No orders of the defined types were placed in this encounter.    Meds ordered this encounter  Medications   Albuterol -Budesonide (AIRSUPRA ) 90-80 MCG/ACT AERO    Sig: Inhale 2 puffs into the lungs every 4 (four) hours as needed.    Dispense:  5.9 g    Refill:  3   cefTRIAXone  (ROCEPHIN ) injection 1 g    Return in about 3 months (around 10/13/2024) for meds OV.  Corean LITTIE Ku, FNP

## 2024-07-15 NOTE — Patient Instructions (Signed)
 Welcome to [facility]!  Thank you for choosing us  for your Primary Care needs.   We offer in person and video appointments for your convenience. You may call our office to schedule appointments, or you may schedule appointments with me through MyChart.   The best way to get in contact with me is via MyChart message. This will get to me faster than a phone call, unless there is an emergency, then please call 911.  The lab is located downstairs in the Sports Medicine building, we also have xray available there.   You have received an antibiotic injection in the office today.   If you are not feeling better over the next 2-3 days, please follow up with me and let me look at your ears again.  Follow up with me in about 3 months for labs and medication management, sooner if needed.

## 2024-07-29 ENCOUNTER — Encounter: Payer: Self-pay | Admitting: Family Medicine

## 2024-07-29 ENCOUNTER — Ambulatory Visit: Admitting: Family Medicine

## 2024-07-29 VITALS — BP 112/74 | HR 92 | Temp 98.5°F | Ht 59.0 in | Wt 190.0 lb

## 2024-07-29 DIAGNOSIS — F331 Major depressive disorder, recurrent, moderate: Secondary | ICD-10-CM | POA: Diagnosis not present

## 2024-07-29 DIAGNOSIS — H6592 Unspecified nonsuppurative otitis media, left ear: Secondary | ICD-10-CM

## 2024-07-29 MED ORDER — AMOXICILLIN-POT CLAVULANATE 875-125 MG PO TABS: 1.0000 | ORAL_TABLET | Freq: Two times a day (BID) | ORAL | 0 refills | Status: AC

## 2024-07-29 MED ORDER — FLUCONAZOLE 150 MG PO TABS: ORAL_TABLET | ORAL | 0 refills | Status: AC

## 2024-07-29 MED ORDER — CITALOPRAM HYDROBROMIDE 40 MG PO TABS: 40.0000 mg | ORAL_TABLET | Freq: Every day | ORAL | 1 refills | Status: AC

## 2024-07-29 MED ORDER — CEFTRIAXONE SODIUM 1 G IJ SOLR
1.0000 g | Freq: Once | INTRAMUSCULAR | Status: AC
  Administered 2024-07-29: 1 g via INTRAMUSCULAR

## 2024-07-29 NOTE — Patient Instructions (Addendum)
 You have received an antibiotic injection in the office today.   I have sent in Augmentin  for you to take twice a day for 10 days. This medication can upset your stomach, so I tell everyone to take it with a meal.  I have also sent in Diflucan  for you to take in case of yeast after antibiotic treatment.  If you are having symptoms when you complete antibiotics, may take 1 tablet.  If you are still having symptoms 3 days later, may take the second tablet.   Refilled celexa  at new 40mg  dose.   Follow up with me in about 3 months for labs and medication management, sooner if needed.

## 2024-07-29 NOTE — Progress Notes (Signed)
 "  Acute Office Visit  Subjective:     Patient ID: Gabriella Porter, female    DOB: 12-25-1960, 63 y.o.   MRN: [id number]  Chief Complaint  Patient presents with   Acute Visit    Ear ache has come back     HPI  Discussed the use of AI scribe software for clinical note transcription with the patient, who gave verbal consent to proceed.  History of Present Illness Gabriella Porter is a 63 year old female who presents with ear discomfort and concerns about her antidepressant medication dosage.  Unilateral ear discomfort - Left ear discomfort localized to one side with visible erythema. - Affected ear protrudes more than the contralateral ear. - No fever. - Significant relief previously achieved with an injection for a similar episode. - Inquires about need for repeat injection.  Depressive symptoms and antidepressant management - Currently taking Celexa  for depression. - Recent increase in Celexa  dosage from 20 mg to 40 mg due to heightened stress and workload. - Improvement in mood on higher dose. - No suicidal ideation. - Ongoing feelings of being overwhelmed by work and financial stress. - History of antidepressant use for over twelve years. - Longstanding obsessive-compulsive cleanliness behaviors.  Chronic back problems - Ongoing back problems without specific symptoms or current treatment provided.  Psychosocial stressors - Works long hours as an company secretary; finds work generally satisfying but stressful. [place] - Experiencing financial strain related to home expenses and debt, contributing to stress and mood concerns.     ROS Per HPI      Objective:    BP 112/74 (BP Location: Left Arm, Patient Position: Sitting)   Pulse 92   Temp 98.5 F (36.9 C) (Temporal)   Ht 4' 11 (1.499 m)   Wt 190 lb (86.2 kg)   LMP 08/07/2001   SpO2 92%   BMI 38.38 kg/m    Physical Exam Vitals and nursing note reviewed.  Constitutional:       General: She is not in acute distress.    Appearance: She is obese.  HENT:     Head: Normocephalic and atraumatic.     Right Ear: External ear normal. A middle ear effusion is present. Tympanic membrane is erythematous and bulging.     Left Ear: External ear normal. Tympanic membrane is erythematous and bulging.     Nose: Nose normal.     Mouth/Throat:     Mouth: Mucous membranes are moist.     Pharynx: Oropharynx is clear.  Eyes:     Extraocular Movements: Extraocular movements intact.     Pupils: Pupils are equal, round, and reactive to light.  Cardiovascular:     Rate and Rhythm: Normal rate and regular rhythm.     Pulses: Normal pulses.     Heart sounds: Normal heart sounds.  Pulmonary:     Effort: Pulmonary effort is normal. No respiratory distress.     Breath sounds: Normal breath sounds. No wheezing, rhonchi or rales.  Musculoskeletal:        General: Normal range of motion.     Cervical back: Normal range of motion.     Right lower leg: No edema.     Left lower leg: No edema.  Lymphadenopathy:     Cervical: No cervical adenopathy.  Neurological:     General: No focal deficit present.     Mental Status: She is alert and oriented to person, place, and time.  Psychiatric:  Mood and Affect: Mood normal.        Thought Content: Thought content normal.     No results found for any visits on 07/29/24.      Assessment & Plan:   Assessment and Plan Assessment & Plan Otitis media with effusion, left ear Otitis media with effusion, left ear. Previous antibiotic injection provided relief, indicating bacterial involvement. Insufficient treatment with single antibiotic dose considered. - Administered antibiotic injection. - Prescribed Augmentin  with food to minimize gastrointestinal upset. - Prescribed medication for potential yeast infection due to antibiotic use.  Major depressive disorder, recurrent, moderate Recurrent moderate major depressive disorder  exacerbated by stress. Increased Citalopram  dose to 40 mg daily improved mood. No suicidal ideation. - Continue Citalopram  at 40 mg daily. - Refilled Citalopram  prescription. - Monitor mood and response to increased dose over 4-6 weeks.     No orders of the defined types were placed in this encounter.    Meds ordered this encounter  Medications   cefTRIAXone  (ROCEPHIN ) injection 1 g   amoxicillin -clavulanate (AUGMENTIN ) 875-125 MG tablet    Sig: Take 1 tablet by mouth 2 (two) times daily.    Dispense:  20 tablet    Refill:  0   citalopram  (CELEXA ) 40 MG tablet    Sig: Take 1 tablet (40 mg total) by mouth daily.    Dispense:  90 tablet    Refill:  1   fluconazole  (DIFLUCAN ) 150 MG tablet    Sig: Take 150mg  tablet once, if symptoms are still present in 3 days, may take the second tablet.    Dispense:  2 tablet    Refill:  0    Return in about 3 months (around 10/27/2024) for cpe.  Corean LITTIE Ku, FNP  "

## 2024-10-27 ENCOUNTER — Ambulatory Visit: Admitting: Family Medicine
